# Patient Record
Sex: Female | Born: 1940 | Race: Black or African American | Hispanic: No | Smoking: Never smoker
Health system: Southern US, Community
[De-identification: ages and names within clinical notes are randomized; demographics above are authoritative.]

## PROBLEM LIST (undated history)

## (undated) DIAGNOSIS — I10 Essential (primary) hypertension: Secondary | ICD-10-CM

## (undated) DIAGNOSIS — D869 Sarcoidosis, unspecified: Secondary | ICD-10-CM

## (undated) DIAGNOSIS — H269 Unspecified cataract: Secondary | ICD-10-CM

---

## 1998-05-29 ENCOUNTER — Other Ambulatory Visit: Admission: RE | Admit: 1998-05-29 | Discharge: 1998-05-29 | Payer: Self-pay | Admitting: Obstetrics and Gynecology

## 1998-06-20 ENCOUNTER — Ambulatory Visit (HOSPITAL_COMMUNITY): Admission: RE | Admit: 1998-06-20 | Discharge: 1998-06-20 | Payer: Self-pay | Admitting: Obstetrics and Gynecology

## 2000-06-01 ENCOUNTER — Encounter: Admission: RE | Admit: 2000-06-01 | Discharge: 2000-06-01 | Payer: Self-pay | Admitting: *Deleted

## 2000-09-26 ENCOUNTER — Encounter: Payer: Self-pay | Admitting: *Deleted

## 2000-09-26 ENCOUNTER — Encounter: Admission: RE | Admit: 2000-09-26 | Discharge: 2000-09-26 | Payer: Self-pay | Admitting: *Deleted

## 2001-07-13 ENCOUNTER — Other Ambulatory Visit: Admission: RE | Admit: 2001-07-13 | Discharge: 2001-07-13 | Payer: Self-pay | Admitting: Obstetrics and Gynecology

## 2001-10-06 ENCOUNTER — Encounter: Admission: RE | Admit: 2001-10-06 | Discharge: 2001-10-06 | Payer: Self-pay | Admitting: Internal Medicine

## 2001-10-06 ENCOUNTER — Encounter: Payer: Self-pay | Admitting: Internal Medicine

## 2002-11-27 ENCOUNTER — Encounter: Admission: RE | Admit: 2002-11-27 | Discharge: 2002-11-27 | Payer: Self-pay | Admitting: Internal Medicine

## 2002-11-27 ENCOUNTER — Encounter: Payer: Self-pay | Admitting: Internal Medicine

## 2003-11-29 ENCOUNTER — Encounter: Admission: RE | Admit: 2003-11-29 | Discharge: 2003-11-29 | Payer: Self-pay | Admitting: Internal Medicine

## 2003-12-23 ENCOUNTER — Other Ambulatory Visit: Admission: RE | Admit: 2003-12-23 | Discharge: 2003-12-23 | Payer: Self-pay | Admitting: Obstetrics and Gynecology

## 2004-01-30 ENCOUNTER — Inpatient Hospital Stay (HOSPITAL_COMMUNITY): Admission: RE | Admit: 2004-01-30 | Discharge: 2004-02-01 | Payer: Self-pay | Admitting: Obstetrics and Gynecology

## 2004-01-30 ENCOUNTER — Encounter (INDEPENDENT_AMBULATORY_CARE_PROVIDER_SITE_OTHER): Payer: Self-pay | Admitting: Specialist

## 2004-11-12 IMAGING — CR DG CHEST 2V
2 series · 2 of 2 positions shown · non-contrast
Comparison: none

CLINICAL DATA: Preoperative respiratory exam. Fibroids and uterine bleeding.  History of sarcoidosis.
 TWO VIEW CHEST   
 The heart size is normal.  There are diffuse interstitial lung changes with hilar adenopathy.  There is no effusion or active infiltrate.
 IMPRESSION
 1.  No acute abnormality.
 2.  Interstitial lung disease and hilar adenopathy compatible with the clinical diagnosis of sarcoidosis.

[view not recorded (1 of 2)]
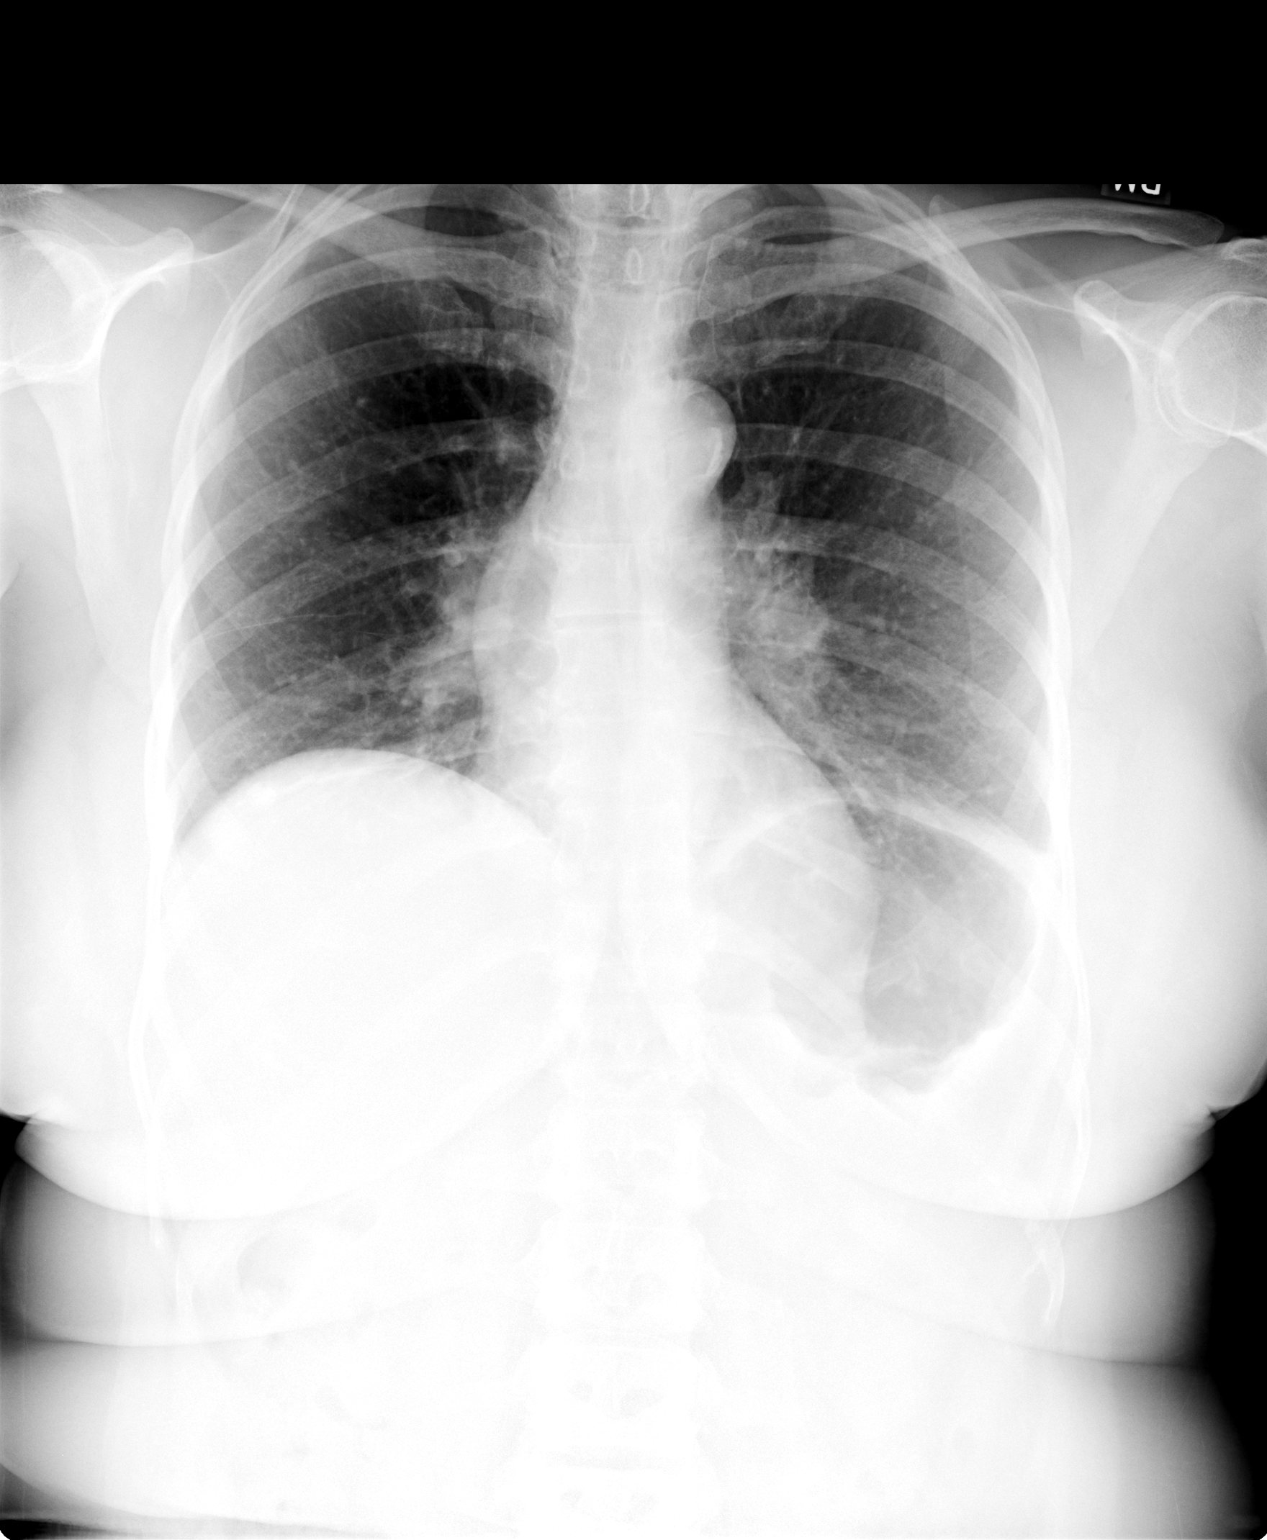

[view not recorded (2 of 2)]
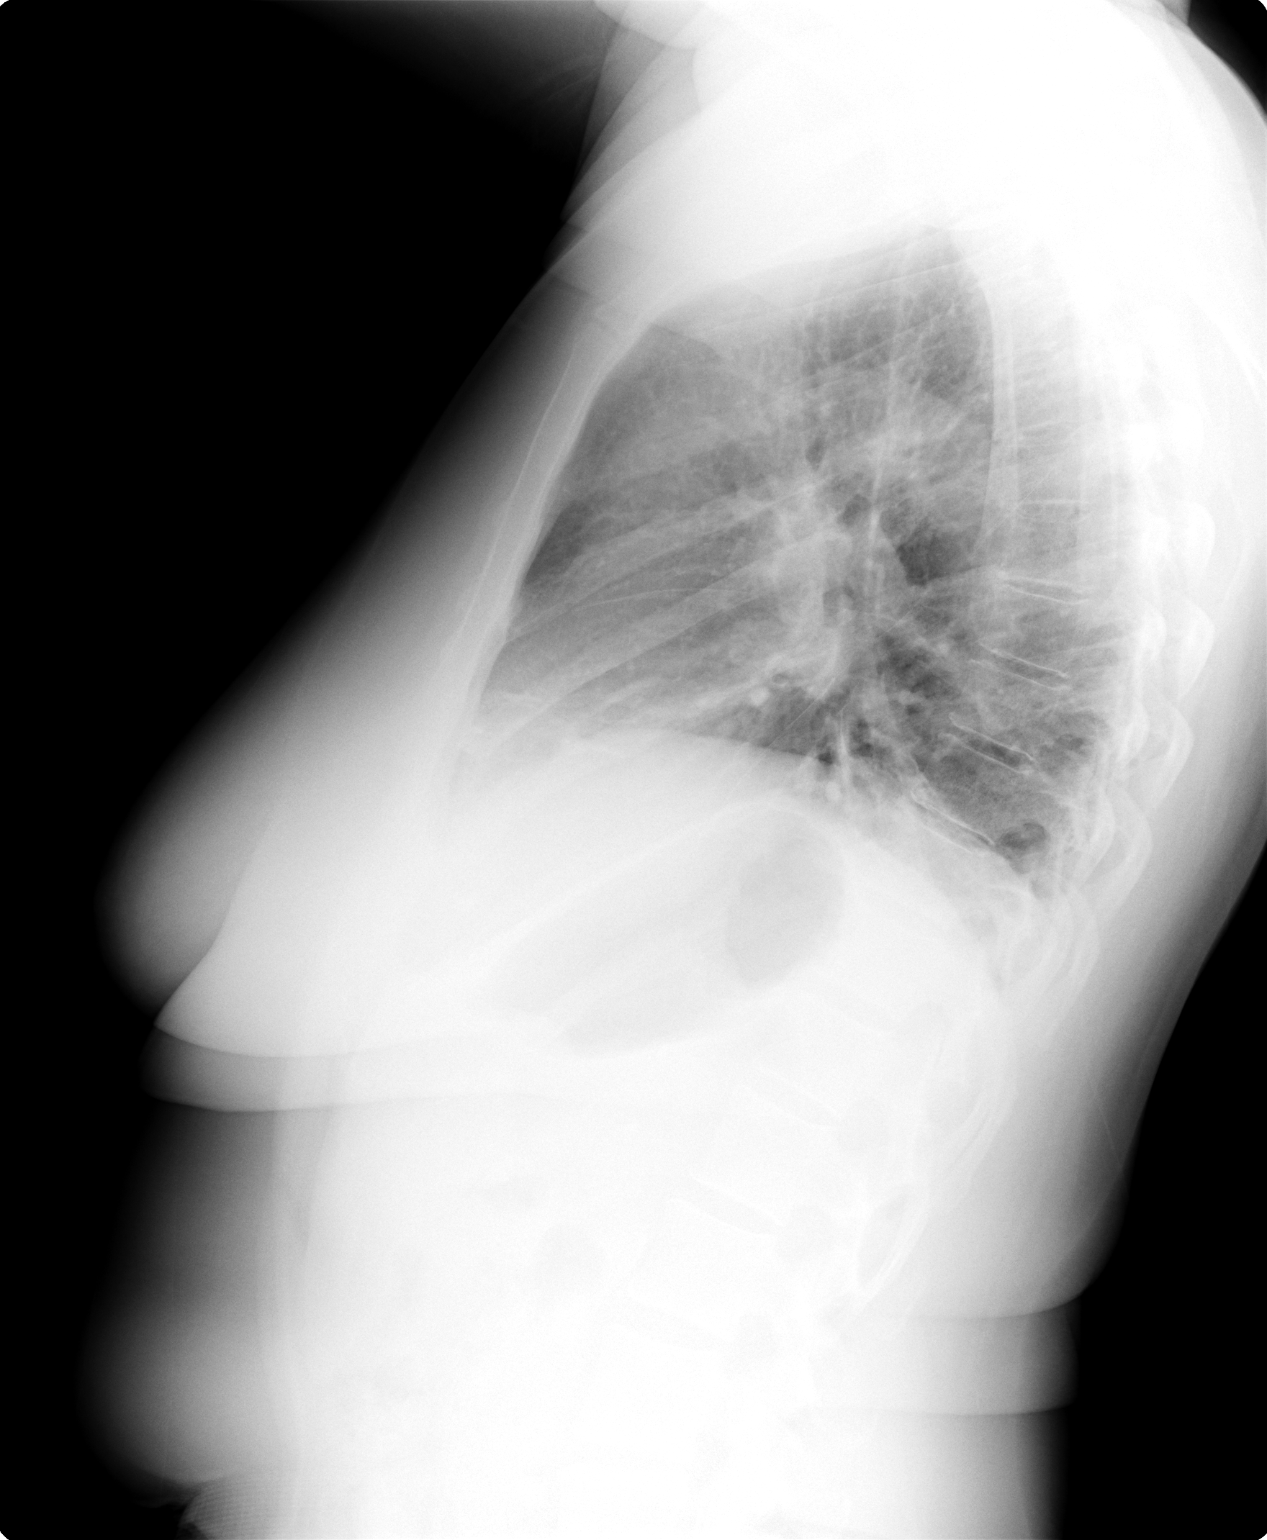

[2 of 2 positions shown; findings below may reference images not displayed]

## 2004-12-16 ENCOUNTER — Encounter: Admission: RE | Admit: 2004-12-16 | Discharge: 2004-12-16 | Payer: Self-pay | Admitting: Internal Medicine

## 2004-12-29 ENCOUNTER — Encounter: Admission: RE | Admit: 2004-12-29 | Discharge: 2004-12-29 | Payer: Self-pay | Admitting: Internal Medicine

## 2006-01-17 ENCOUNTER — Encounter: Admission: RE | Admit: 2006-01-17 | Discharge: 2006-01-17 | Payer: Self-pay | Admitting: Internal Medicine

## 2007-01-27 ENCOUNTER — Encounter: Admission: RE | Admit: 2007-01-27 | Discharge: 2007-01-27 | Payer: Self-pay | Admitting: Internal Medicine

## 2007-02-10 ENCOUNTER — Encounter: Admission: RE | Admit: 2007-02-10 | Discharge: 2007-02-10 | Payer: Self-pay | Admitting: Internal Medicine

## 2008-02-16 ENCOUNTER — Encounter: Admission: RE | Admit: 2008-02-16 | Discharge: 2008-02-16 | Payer: Self-pay | Admitting: Internal Medicine

## 2009-04-15 ENCOUNTER — Encounter: Admission: RE | Admit: 2009-04-15 | Discharge: 2009-04-15 | Payer: Self-pay | Admitting: Internal Medicine

## 2010-09-16 ENCOUNTER — Encounter: Admission: RE | Admit: 2010-09-16 | Discharge: 2010-09-16 | Payer: Self-pay | Admitting: Internal Medicine

## 2011-01-03 ENCOUNTER — Encounter: Payer: Self-pay | Admitting: Internal Medicine

## 2011-04-30 NOTE — Discharge Summary (Signed)
NAME:  [name] Combs, [name] Combs NO.:  [id number]   MEDICAL RECORD NO.:  [id number]                   PATIENT TYPE:  INP   LOCATION:  0453                                 FACILITY:  [facility]   PHYSICIAN:  Malachi Pro. Ambrose Mantle, M.D.              DATE OF BIRTH:  December 20, 1940   DATE OF ADMISSION:  01/30/2004  DATE OF DISCHARGE:  02/01/2004                                 DISCHARGE SUMMARY   HISTORY:  This is a 70 year old black female admitted with persistent  postmenopausal bleeding and known leiomyomata uteri for abdominal  hysterectomy and bilateral salpingo-oophorectomy.  The patient underwent  that procedure by Dr. Ambrose Mantle with Dr. Jackelyn Knife assisting under general  anesthesia on January 30, 2004.  Postoperatively, she did well.  She did  have a temperature elevation to 100.6 degrees on the evening of the first  postoperative day but she had no symptoms.  Cath urine was negative except  for 7-10 red cells that were attributable to having had the catheter.  Her  abdomen was soft and nontender.  Her incision was healing well, the staples  were intact. She was passing flatus without difficulty, tolerating a regular  diet and she was considered a candidate for discharge on the second  postoperative day.   LABORATORY DATA:  Hemoglobin on admission 14.1, hematocrit 41.8, white count  4200, platelet count 261,000.  Followup hematocrits were 36.3 and 34.0.  Complete metabolic profile was normal except for a glucose of 165.  The  specimen was obtained at 10 a.m. on January 24, 2004 for the complete  metabolic profile.  Urinalysis on admission showed many red cells because  she had vaginal bleeding.  Path report showed uterus, fallopian tubes and  ovaries.  The cervix had benign ectocervical and endocervical mucosa. The  uterine corpus had a benign endometrial polyp, cystic atrophic type  endometrium, submucosal and intramural leiomyomata, benign ovaries and  fallopian tubes  bilaterally. The uterine weight was 276 g.  The uterus had a  few submucosal leiomyomas ranging from 1.1 to 5.2 cm in greatest dimension.  The myometrium was distorted by multiple intramural leiomyomas ranging from  0.3 to 3 cm.  There were a few red soft endometrial polyps ranging from 0.9  to 1.5 cm in greatest dimension.  Chest x-ray showed no acute abnormality,  there was interstitial lung disease and hilar adenopathy compatible with a  clinical diagnosis of sarcoidosis.  EKG showed a normal sinus rhythm, no  change in the EKG.   FINAL DIAGNOSES:  1. Leiomyomata uteri submucous and intramural.  2. Endometrial polyps.  3. Persistent postmenopausal bleeding.  4. Sarcoidosis.  5. Gout.  6. Hypertension.   OPERATION:  Abdominal hysterectomy, bilateral salpingo-oophorectomy.   FINAL CONDITION:  Improved.   DISCHARGE INSTRUCTIONS:  1. Regular diet.  2. No vaginal entrance.  3. No heavy lifting or strenuous activity.  4. Cough and deep  breath frequently holding her incision at the time.  5. Report any temperature elevation greater than 101 degrees.  6. Return to see me in two days to have her staples removed and to review     her temperature record.   OPERATION:  Abdominal hysterectomy, bilateral salpingo-oophorectomy.   MEDICATIONS:  1. Percocet 5/325, 20 tablets one every 4-6h. as needed for pain.  2. The patient will resume her Benicar 40 mg daily.  3. Allopurinol 100 mg daily.  4. She will take colchicine 0.6 mg p.r.n. for gout attacks.   I will see the patient in the office in two days and in the meantime she is  to take her temperature every 4h. while awake and report the temperature  elevation that I described earlier.                                               Malachi Pro. Ambrose Mantle, M.D.    TFH/MEDQ  D:  02/01/2004  T:  02/01/2004  Job:  [id number]

## 2011-04-30 NOTE — Op Note (Signed)
NAME:  [name] Combs, [name] Combs NO.:  [id number]   MEDICAL RECORD NO.:  [id number]                   PATIENT TYPE:  INP   LOCATION:  0001                                 FACILITY:  [facility]   PHYSICIAN:  Malachi Pro. Ambrose Mantle, M.D.              DATE OF BIRTH:  1941/09/23   DATE OF PROCEDURE:  01/30/2004  DATE OF DISCHARGE:                                 OPERATIVE REPORT   PREOPERATIVE DIAGNOSES:  Leiomyomata uteri, persistent postmenopausal  bleeding.   POSTOPERATIVE DIAGNOSES:  Leiomyomata uteri, persistent postmenopausal  bleeding.   OPERATION:  Abdominal hysterectomy, bilateral salpingo-oophorectomy.   SURGEON:  Malachi Pro. Ambrose Mantle, M.D.   ASSISTANT:  Zenaida Niece, M.D.   ANESTHESIA:  General.   The patient was brought to the operating room and placed under satisfactory  general anesthesia, placed in frog leg position. The vulva, vagina,  perineum, urethra and abdomen were prepped with Betadine solution.  A Foley  catheter was inserted to straight drain.  The patient's abdominal wall was  quite abnormal in shape with a panniculus between the pubis and the  umbilicus.  The patient was then placed supine, the abdomen was draped as a  sterile field. A suprapubic transverse incision was made and carried in  layers through the skin, subcutaneous tissue and fascia. An incision was  made higher on the lower abdominal wall to try to avoid the crease formed by  her panniculus.  The fascia was then separated from the rectus muscle  superiorly and inferiorly, the rectus muscle was already split in the  midline, the peritoneum was opened vertically.  Exploration of the upper  abdomen revealed the liver to be smooth, both kidneys felt slightly small  but no other abnormalities.  I did not feel the gallbladder. The bowel  looked normal. Exploration of the pelvis revealed the tubes and ovaries to  be normal bilaterally. The uterus was enlarged to probably 10-12 week  size  with multiple fibroids.  Packs and retractors were used for exposure. The  upper pedicles were clamped across with Kelly clamps. Both infundibulopelvic  ligament were divided between clamps and doubly suture ligated. The round  ligaments were divided with the electrical current, bladder flap was  developed, uterine vessels were skeletonized, clamped, cut and suture  ligated.  Parametrial and paracervical tissues were clamped, cut and suture  ligated and the uterosacral ligaments were clamped, cut and suture ligated  and held.  The vaginal angles were entered and the uterus was removed by  transecting the vagina, vaginal angles sutures were placed and then the  central portion of the cuff was closed with interrupted figure-of-eight  sutures of #0 Vicryl.  Hemostasis was obtained with a combination of  electrical current and with suture.  Liberal irrigation confirmed  hemostasis, the bladder was filled with methylene blue stained, fluid.  There was no leakage and there were no sutures  close to the bladder.  Reperitonealization was done across the vaginal cuff.  Ureters were palpated  and were completely normal.  Packs and retractors were removed and the  abdominal wall was closed with a running suture of #0 Vicryl on the  peritoneum.  Interrupted #0 Vicryl on the rectus muscles, two running  sutures of #0 Vicryl on the fascia, a running  3-0 Vicryl on the subcu tissues, staples on the skin.  The patient seemed to  tolerate the procedure well. Blood loss was about 450 mL.  This  was because  the patient had significant bleeding from the right side of the uterus early  in the case when visibility was minimal laterally.                                               Malachi Pro. Ambrose Mantle, M.D.    TFH/MEDQ  D:  01/30/2004  T:  01/30/2004  Job:  [id number]

## 2011-04-30 NOTE — H&P (Signed)
NAME:  Combs Combs NO.:  [id number]   MEDICAL RECORD NO.:  [id number]                   PATIENT TYPE:  INP   LOCATION:  NA                                   FACILITY:  [facility]   PHYSICIAN:  Malachi Pro. Ambrose Mantle, M.D.              DATE OF BIRTH:  04-07-1941   DATE OF ADMISSION:  DATE OF DISCHARGE:                                HISTORY & PHYSICAL   ANTICIPATED DATE OF ADMISSION:  January 30, 2004.   HISTORY OF PRESENT ILLNESS:  [age]-year-old black female para 2, 0, 0, 2  who is admitted to the hospital for an abdominal hysterectomy and bilateral  salpingo-oophorectomy because of persistent abnormal uterine bleeding and  enlarging fibroids.   I saw this patient initially in 1990 and performed an endometrial biopsy.  Since then I have performed numerous endometrial biopsies for abnormal  bleeding.  At the present time she has been bleeding everyday for at least  six weeks and up to that point was having intermittent bleeding lasting up  to two weeks at irregular intervals.  The patient underwent her last  endometrial biopsy on December 23, 2003 that showed abundant blood clot  containing scant strips of benign columnar mucosa.  The patient underwent a  D&C and hysteroscopic resection of leiomyoma in 1999, but because the  fibroids were located low in the lower uterine segment and endocervical  canal it was impossible to remove all them.  I have advised her to have a  hysterectomy on more than one occasions and she has finally elected to  proceed with that.   ALLERGIES:  ASPIRIN.   PAST SURGICAL HISTORY:  1. D&C hysteroscopy.  2. Hysteroscopic resection of benign fibroids.  3. Bronchoscopy.   PAST MEDICAL HISTORY:  Sarcoidosis.   FAMILY HISTORY:  Mother died at 37 of a stroke.  Father died at 46 of a  stroke.  On sister and three brothers are living and well.   SOCIAL HISTORY:  The patient does not drink or smoke.   REVIEW OF SYSTEMS:   Review of systems is negative.   MEDICATIONS:  Benicar and a gastrointestinal medication.   PHYSICAL EXAMINATION:  GENERAL APPEARANCE:  Physical exam reveals a well-  developed, well-nourished black female in no distress.  VITAL SIGNS:  Blood pressure 136/90-Monocryl pulse of 80 and weight 181  pounds.  HEENT:  Head, eyes, ears, nose, and throat reveal no cranial abnormalities.  Extraocular movements are intact.  Nose and pharynx are clear.  There is a  torus palatinus.  BREASTS:  The breasts are soft without masses.  NECK:  Neck supple without thyromegaly.  HEART:  Normal size and sounds.  No murmurs.  LUNGS:  Lungs are clear to P&A.  ABDOMEN:  Abdomen is soft and nontender.  No masses are palpable.  Liver,  spleen and kidneys are not felt.  VAGINAL EXAMINATION:  Vulva and  vagina show some blood to be present. The  cervix is clean.  The uterus is very retroverted, approximately 14 weeks  size, it feels like a grapefruit.  The adnexa are clear.  RECTAL:  Rectal exam on December 23, 2003 was negative.  PELVIC EXAMINATION:  Pap smear on December 23, 2003 was within normal limits.   ADMITTING IMPRESSION:  1. Large leiomyomata uteri.  2. Persistent postmenopausal uterine bleeding.  3. Sarcoidosis.   The patient is admitted for abdominal hysterectomy and bilateral salpingo-  oophorectomy. She has been informed of the risks of surgery including, but  not limited to heart attack, stroke, pulmonary embolus, wound disruption,  hemorrhage with need for reoperation and/or transfusion, fistula formation,  nerve injury, and intestinal obstruction.  She understands and agrees to  proceed with surgery.  She has also been informed that the surgery could  affect her sex drive which is now significantly less than it was when she  was a younger woman.                                               Malachi Pro. Ambrose Mantle, M.D.    TFH/MEDQ  D:  01/29/2004  T:  01/30/2004  Job:  [id number]

## 2011-08-10 ENCOUNTER — Other Ambulatory Visit: Payer: Self-pay | Admitting: Internal Medicine

## 2011-08-10 DIAGNOSIS — Z1231 Encounter for screening mammogram for malignant neoplasm of breast: Secondary | ICD-10-CM

## 2011-09-20 ENCOUNTER — Ambulatory Visit
Admission: RE | Admit: 2011-09-20 | Discharge: 2011-09-20 | Disposition: A | Discharge status: Home / Self Care | Payer: Medicare Other | Source: Ambulatory Visit | Attending: Internal Medicine | Admitting: Internal Medicine

## 2011-09-20 DIAGNOSIS — Z1231 Encounter for screening mammogram for malignant neoplasm of breast: Secondary | ICD-10-CM

## 2012-08-16 ENCOUNTER — Other Ambulatory Visit: Payer: Self-pay | Admitting: Internal Medicine

## 2012-08-16 DIAGNOSIS — Z1231 Encounter for screening mammogram for malignant neoplasm of breast: Secondary | ICD-10-CM

## 2012-10-02 ENCOUNTER — Ambulatory Visit
Admission: RE | Admit: 2012-10-02 | Discharge: 2012-10-02 | Disposition: A | Discharge status: Home / Self Care | Payer: Medicare Other | Source: Ambulatory Visit | Attending: Internal Medicine | Admitting: Internal Medicine

## 2012-10-02 DIAGNOSIS — Z1231 Encounter for screening mammogram for malignant neoplasm of breast: Secondary | ICD-10-CM

## 2013-08-28 ENCOUNTER — Other Ambulatory Visit: Payer: Self-pay

## 2013-08-28 DIAGNOSIS — Z1231 Encounter for screening mammogram for malignant neoplasm of breast: Secondary | ICD-10-CM

## 2013-10-03 ENCOUNTER — Ambulatory Visit
Admission: RE | Admit: 2013-10-03 | Discharge: 2013-10-03 | Disposition: A | Discharge status: Home / Self Care | Payer: Medicare Other | Source: Ambulatory Visit

## 2013-10-03 DIAGNOSIS — Z1231 Encounter for screening mammogram for malignant neoplasm of breast: Secondary | ICD-10-CM

## 2014-09-03 ENCOUNTER — Other Ambulatory Visit: Payer: Self-pay

## 2014-09-03 DIAGNOSIS — Z1231 Encounter for screening mammogram for malignant neoplasm of breast: Secondary | ICD-10-CM

## 2014-10-04 ENCOUNTER — Ambulatory Visit
Admission: RE | Admit: 2014-10-04 | Discharge: 2014-10-04 | Disposition: A | Discharge status: Home / Self Care | Payer: Medicare Other | Source: Ambulatory Visit

## 2014-10-04 DIAGNOSIS — Z1231 Encounter for screening mammogram for malignant neoplasm of breast: Secondary | ICD-10-CM

## 2015-10-06 ENCOUNTER — Other Ambulatory Visit: Payer: Self-pay

## 2015-10-06 DIAGNOSIS — Z1231 Encounter for screening mammogram for malignant neoplasm of breast: Secondary | ICD-10-CM

## 2015-10-16 ENCOUNTER — Ambulatory Visit
Admission: RE | Admit: 2015-10-16 | Discharge: 2015-10-16 | Disposition: A | Discharge status: Home / Self Care | Payer: Medicare Other | Source: Ambulatory Visit

## 2015-10-16 DIAGNOSIS — Z1231 Encounter for screening mammogram for malignant neoplasm of breast: Secondary | ICD-10-CM

## 2016-09-13 ENCOUNTER — Other Ambulatory Visit: Payer: Self-pay | Admitting: Internal Medicine

## 2016-09-13 DIAGNOSIS — Z1231 Encounter for screening mammogram for malignant neoplasm of breast: Secondary | ICD-10-CM

## 2016-10-18 ENCOUNTER — Ambulatory Visit
Admission: RE | Admit: 2016-10-18 | Discharge: 2016-10-18 | Disposition: A | Discharge status: Home / Self Care | Payer: Medicare Other | Source: Ambulatory Visit | Attending: Internal Medicine | Admitting: Internal Medicine

## 2016-10-18 DIAGNOSIS — Z1231 Encounter for screening mammogram for malignant neoplasm of breast: Secondary | ICD-10-CM

## 2017-09-07 ENCOUNTER — Other Ambulatory Visit: Payer: Self-pay | Admitting: Internal Medicine

## 2017-09-07 DIAGNOSIS — Z1231 Encounter for screening mammogram for malignant neoplasm of breast: Secondary | ICD-10-CM

## 2017-10-19 ENCOUNTER — Ambulatory Visit
Admission: RE | Admit: 2017-10-19 | Discharge: 2017-10-19 | Disposition: A | Discharge status: Home / Self Care | Payer: Medicare Other | Source: Ambulatory Visit | Attending: Internal Medicine | Admitting: Internal Medicine

## 2017-10-19 DIAGNOSIS — Z1231 Encounter for screening mammogram for malignant neoplasm of breast: Secondary | ICD-10-CM

## 2018-10-02 ENCOUNTER — Other Ambulatory Visit: Payer: Self-pay | Admitting: Internal Medicine

## 2018-10-02 DIAGNOSIS — Z1231 Encounter for screening mammogram for malignant neoplasm of breast: Secondary | ICD-10-CM

## 2018-11-07 ENCOUNTER — Ambulatory Visit: Payer: Medicare Other

## 2019-01-04 ENCOUNTER — Inpatient Hospital Stay: Admission: RE | Admit: 2019-01-04 | Payer: Medicare Other | Source: Ambulatory Visit

## 2022-09-09 ENCOUNTER — Encounter (HOSPITAL_COMMUNITY): Payer: Self-pay

## 2022-09-09 ENCOUNTER — Other Ambulatory Visit: Payer: Self-pay

## 2022-09-09 ENCOUNTER — Encounter (HOSPITAL_BASED_OUTPATIENT_CLINIC_OR_DEPARTMENT_OTHER): Payer: Self-pay | Admitting: Emergency Medicine

## 2022-09-09 ENCOUNTER — Emergency Department (HOSPITAL_BASED_OUTPATIENT_CLINIC_OR_DEPARTMENT_OTHER): Payer: Medicare PPO

## 2022-09-09 ENCOUNTER — Emergency Department (HOSPITAL_BASED_OUTPATIENT_CLINIC_OR_DEPARTMENT_OTHER): Payer: Medicare PPO | Admitting: Radiology

## 2022-09-09 ENCOUNTER — Inpatient Hospital Stay (HOSPITAL_BASED_OUTPATIENT_CLINIC_OR_DEPARTMENT_OTHER)
Admission: EM | Admit: 2022-09-09 | Discharge: 2022-09-12 | DRG: 689 | Disposition: A | Discharge status: Home Health | Payer: Medicare PPO | Attending: Internal Medicine | Admitting: Internal Medicine

## 2022-09-09 DIAGNOSIS — G9341 Metabolic encephalopathy: Secondary | ICD-10-CM | POA: Diagnosis present

## 2022-09-09 DIAGNOSIS — Z20822 Contact with and (suspected) exposure to covid-19: Secondary | ICD-10-CM | POA: Diagnosis present

## 2022-09-09 DIAGNOSIS — D860 Sarcoidosis of lung: Secondary | ICD-10-CM | POA: Diagnosis present

## 2022-09-09 DIAGNOSIS — Z885 Allergy status to narcotic agent status: Secondary | ICD-10-CM

## 2022-09-09 DIAGNOSIS — F05 Delirium due to known physiological condition: Secondary | ICD-10-CM | POA: Diagnosis present

## 2022-09-09 DIAGNOSIS — N390 Urinary tract infection, site not specified: Secondary | ICD-10-CM | POA: Diagnosis not present

## 2022-09-09 DIAGNOSIS — E860 Dehydration: Secondary | ICD-10-CM | POA: Diagnosis present

## 2022-09-09 DIAGNOSIS — Z91018 Allergy to other foods: Secondary | ICD-10-CM

## 2022-09-09 DIAGNOSIS — I10 Essential (primary) hypertension: Secondary | ICD-10-CM | POA: Diagnosis present

## 2022-09-09 DIAGNOSIS — Z79899 Other long term (current) drug therapy: Secondary | ICD-10-CM

## 2022-09-09 DIAGNOSIS — R824 Acetonuria: Secondary | ICD-10-CM | POA: Diagnosis present

## 2022-09-09 DIAGNOSIS — R4182 Altered mental status, unspecified: Secondary | ICD-10-CM | POA: Diagnosis not present

## 2022-09-09 DIAGNOSIS — F03918 Unspecified dementia, unspecified severity, with other behavioral disturbance: Secondary | ICD-10-CM | POA: Diagnosis present

## 2022-09-09 DIAGNOSIS — B9620 Unspecified Escherichia coli [E. coli] as the cause of diseases classified elsewhere: Secondary | ICD-10-CM | POA: Diagnosis present

## 2022-09-09 HISTORY — DX: Essential (primary) hypertension: I10

## 2022-09-09 HISTORY — DX: Unspecified cataract: H26.9

## 2022-09-09 HISTORY — DX: Sarcoidosis, unspecified: D86.9

## 2022-09-09 LAB — COMPREHENSIVE METABOLIC PANEL
ALT: 6 U/L (ref 0–44)
AST: 17 U/L (ref 15–41)
Albumin: 3.8 g/dL (ref 3.5–5.0)
Alkaline Phosphatase: 80 U/L (ref 38–126)
Anion gap: 10 (ref 5–15)
BUN: 12 mg/dL (ref 8–23)
CO2: 29 mmol/L (ref 22–32)
Calcium: 10 mg/dL (ref 8.9–10.3)
Chloride: 101 mmol/L (ref 98–111)
Creatinine, Ser: 0.6 mg/dL (ref 0.44–1.00)
GFR, Estimated: >60 mL/min (ref >60)
Glucose, Bld: 73 mg/dL (ref 70–99)
Potassium: 4.4 mmol/L (ref 3.5–5.1)
Sodium: 140 mmol/L (ref 135–145)
Total Bilirubin: 0.9 mg/dL (ref 0.3–1.2)
Total Protein: 7.7 g/dL (ref 6.5–8.1)

## 2022-09-09 LAB — RESP PANEL BY RT-PCR (FLU A&B, COVID) ARPGX2
Influenza A by PCR: NEGATIVE
Influenza B by PCR: NEGATIVE
SARS Coronavirus 2 by RT PCR: NEGATIVE

## 2022-09-09 LAB — CBC WITH DIFFERENTIAL/PLATELET
Abs Immature Granulocytes: 0.01 10*3/uL (ref 0.00–0.07)
Basophils Absolute: 0 10*3/uL (ref 0.0–0.1)
Basophils Relative: 0 %
Eosinophils Absolute: 0.1 10*3/uL (ref 0.0–0.5)
Eosinophils Relative: 2 %
HCT: 39.7 % (ref 36.0–46.0)
Hemoglobin: 12.7 g/dL (ref 12.0–15.0)
Immature Granulocytes: 0 %
Lymphocytes Relative: 18 %
Lymphs Abs: 0.9 10*3/uL (ref 0.7–4.0)
MCH: 29.5 pg (ref 26.0–34.0)
MCHC: 32 g/dL (ref 30.0–36.0)
MCV: 92.3 fL (ref 80.0–100.0)
Monocytes Absolute: 0.7 10*3/uL (ref 0.1–1.0)
Monocytes Relative: 15 %
Neutro Abs: 3.2 10*3/uL (ref 1.7–7.7)
Neutrophils Relative %: 65 %
Platelets: 330 10*3/uL (ref 150–400)
RBC: 4.3 MIL/uL (ref 3.87–5.11)
RDW: 13.3 % (ref 11.5–15.5)
WBC: 4.9 10*3/uL (ref 4.0–10.5)
nRBC: 0 % (ref 0.0–0.2)

## 2022-09-09 LAB — URINALYSIS, ROUTINE W REFLEX MICROSCOPIC
Bilirubin Urine: NEGATIVE
Glucose, UA: NEGATIVE mg/dL
Hgb urine dipstick: NEGATIVE
Nitrite: NEGATIVE
Protein, ur: NEGATIVE mg/dL
Specific Gravity, Urine: 1.015 (ref 1.005–1.030)
pH: 7 (ref 5.0–8.0)

## 2022-09-09 LAB — AMMONIA: Ammonia: 22 umol/L (ref 9–35)

## 2022-09-09 LAB — TSH: TSH: 2.348 u[IU]/mL (ref 0.350–4.500)

## 2022-09-09 MED ORDER — SODIUM CHLORIDE 0.9 % IV SOLN
1.0000 g | Freq: Once | INTRAVENOUS | Status: AC
  Administered 2022-09-09: 1 g via INTRAVENOUS
  Filled 2022-09-09: qty 10

## 2022-09-09 NOTE — ED Provider Notes (Signed)
Signout from Dr. Truett Mainland.  81 year old female brought in by her family member for evaluation of confusion.  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  She has no acute medical complaints. Physical Exam  BP (!) 179/110   Pulse 82   Temp 98.9 F (37.2 C) (Oral)   Resp 19   Ht 5\' 5"  (1.651 m)   Wt 54.4 kg   SpO2 99%   BMI 19.97 kg/m   Physical Exam  Procedures  Procedures  ED Course / MDM   Clinical Course as of 09/09/22 1922  Thu Sep 09, 2022  1556 Signed out to Dr. Melina Copa pending remainder of confusion workup.  [WS]    Clinical Course User Index [WS] Cristie Hem, MD   Medical Decision Making Amount and/or Complexity of Data Reviewed Labs: ordered. Radiology: ordered. ECG/medicine tests: ordered.  Risk Decision regarding hospitalization.   Her work-up has been fairly unremarkable with normal head CT normal labs.  Blood pressure elevated history of hypertension unclear if she has been taking her medications.  Urine is a possible source not having any dysuria.  Will send for culture and cover with antibiotics.  Son was inclined to have her admitted for further work-up.  Discussed with Dr. Posey Pronto [facility] hospitalist who will put the patient in for an admission at either campus.       Hayden Rasmussen, MD 09/10/22 1007

## 2022-09-09 NOTE — ED Provider Notes (Signed)
[place] EMERGENCY DEPT Provider Note  CSN: [id number] Arrival date & time: 09/09/22 1105  Chief Complaint(s) Altered Mental Status  HPI [name] Combs is a 81 y.o. female without significant past medical history presenting to the emergency department for the confusion.  Last night patient apparently left home at 12:30 AM and returned at 2:30 AM, which is very atypical for the patient.  This was noticed because the ring camera went off.  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  Son reports that the car was parked strangely and she had peed on the seat.  Patient currently denies any complaints, denies any headaches, nausea, vomiting, fevers or chills, abdominal pain, chest pain, dysuria, leg swelling.  Son reports that still seems confused   Past Medical History Past Medical History:  Diagnosis Date   Cataract    Hypertension    Sarcoidosis    There are no problems to display for this patient.  Home Medication(s) Prior to Admission medications   Not on File                                                                                                                                    Past Surgical History History reviewed. No pertinent surgical history. Family History History reviewed. No pertinent family history.  Social History Social History   Tobacco Use   Smoking status: Never   Smokeless tobacco: Never  Vaping Use   Vaping Use: Never used  Substance Use Topics   Alcohol use: Never   Drug use: Never   Allergies Codeine and Sesame seed (diagnostic)  Review of Systems Review of Systems  All other systems reviewed and are negative.   Physical Exam Vital Signs  I have reviewed the triage vital signs BP (!) 183/99   Pulse 98   Temp 98.3 F (36.8 C)   Resp 18   Ht 5\' 5"  (1.651 m)   Wt 54.4 kg   SpO2 98%   BMI 19.97 kg/m  Physical  Exam Vitals and nursing note reviewed.  Constitutional:      General: She is not in acute distress.    Appearance: She is well-developed.  HENT:     Head: Normocephalic and atraumatic.     Mouth/Throat:     Mouth: Mucous membranes are moist.  Eyes:     Pupils: Pupils are equal, round, and reactive to light.  Cardiovascular:     Rate and Rhythm: Normal rate and regular rhythm.     Heart sounds: No murmur heard. Pulmonary:     Effort: Pulmonary effort is normal. No respiratory distress.     Breath sounds: Normal breath sounds.  Abdominal:     General: Abdomen is flat.     Palpations: Abdomen is soft.     Tenderness: There is no abdominal  tenderness.  Musculoskeletal:        General: No tenderness.     Right lower leg: No edema.     Left lower leg: No edema.  Skin:    General: Skin is warm and dry.  Neurological:     General: No focal deficit present.     Mental Status: She is alert.     Cranial Nerves: No cranial nerve deficit.     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  Strength grossly intact in the bilateral upper and lower extremities  Psychiatric:        Mood and Affect: Mood normal.        Behavior: Behavior normal.     ED Results and Treatments Labs (all labs ordered are listed, but only abnormal results are displayed) Labs Reviewed  RESP PANEL BY RT-PCR (FLU A&B, COVID) ARPGX2  URINE CULTURE  CBC WITH DIFFERENTIAL/PLATELET  URINALYSIS, ROUTINE W REFLEX MICROSCOPIC  COMPREHENSIVE METABOLIC PANEL  AMMONIA  TSH                                                                                                                          Radiology DG Chest 1 View  Result Date: 09/09/2022 CLINICAL DATA:  Chest pain EXAM: CHEST  1 VIEW COMPARISON:  Chest 01/24/2004 FINDINGS: Heart size and vascularity normal. Atherosclerotic calcification aortic arch  Mild atelectasis in both bases. No acute infiltrate or effusion. No acute skeletal abnormality. IMPRESSION: Mild bibasilar atelectasis. Electronically Signed   By: Marlan Palau M.D.   On: 09/09/2022 14:57    Pertinent labs & imaging results that were available during my care of the patient were reviewed by me and considered in my medical decision making (see MDM for details).  Medications Ordered in ED Medications - No data to display                                                                                                                                   Procedures Procedures  (including critical care time)  Medical Decision Making / ED Course   MDM:  81 year old female presenting to the emergency department with confusion.  Patient overall well-appearing, vitals notable for mild hypertension, mild tachycardia.  Exam overall reassuring other than slightly confused speech although patient is still oriented.  Unclear cause of symptoms, could represent occult infectious source such as pneumonia or urinary infection, will check these.  Could also represent toxic or metabolic encephalopathy, as hyperammonemia, hyponatremia, hypothyroidism.  Will check basic metabolic panel, ammonia, TSH.  Given confusion will also obtain CT scan to evaluate for acute intracranial pathology although lower concern for this without focal neurologic deficit on exam.  Will reassess closely.  If cause unable to be found, patient may still need to be admitted given persistent confusion.  Clinical Course as of 09/09/22 1556  Thu Sep 09, 2022  1556 Signed out to Dr. Charm Barges pending remainder of confusion workup.  [WS]    Clinical Course User Index [WS] Lonell Grandchild, MD     Additional history obtained: -Additional history obtained from family -External records from outside source obtained and reviewed including: Chart review including previous notes, labs, imaging, consultation notes   Lab  Tests: -I ordered, reviewed, and interpreted labs.   The pertinent results include:   Labs Reviewed  RESP PANEL BY RT-PCR (FLU A&B, COVID) ARPGX2  URINE CULTURE  CBC WITH DIFFERENTIAL/PLATELET  URINALYSIS, ROUTINE W REFLEX MICROSCOPIC  COMPREHENSIVE METABOLIC PANEL  AMMONIA  TSH      EKG   EKG Interpretation  Date/Time:    Ventricular Rate:    PR Interval:    QRS Duration:   QT Interval:    QTC Calculation:   R Axis:     Text Interpretation:           Imaging Studies ordered: I ordered imaging studies including CT head, CXR On my interpretation imaging demonstrates mild basilar atelectasis I independently visualized and interpreted imaging. I agree with the radiologist interpretation   Medicines ordered and prescription drug management: No orders of the defined types were placed in this encounter.   -I have reviewed the patients home medicines and have made adjustments as needed    Cardiac Monitoring: The patient was maintained on a cardiac monitor.  I personally viewed and interpreted the cardiac monitored which showed an underlying rhythm of: NSR  Reevaluation: After the interventions noted above, I reevaluated the patient and found that they have stayed the same  Co morbidities that complicate the patient evaluation  Past Medical History:  Diagnosis Date   Cataract    Hypertension    Sarcoidosis       Dispostion: Pending at sign out    Final Clinical Impression(s) / ED Diagnoses Final diagnoses:  Acute confusional state     This chart was dictated using voice recognition software.  Despite best efforts to proofread,  errors can occur which can change the documentation meaning.    Lonell Grandchild, MD 09/09/22 1556

## 2022-09-09 NOTE — ED Triage Notes (Signed)
Pt lives at home alone. Pt brought by son for possible confusion. 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 Ring camera showed Pt leaving at midnight and coming back at 2030.  There was a scratch on the car and the car was was parked off center.  No one talked to her since this morning at approx 0900.  Pt is a modest person and came to door in underwear.   Pt sa alert to self, location, age and month. 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 Pt can follow commands and denis any other complications.  No deficitas NIH 0

## 2022-09-09 NOTE — ED Notes (Addendum)
Pt awake alert and oriented to person place year.  RR even and unlabored on RA with symmetrical rise and fall of chest.  Continuous cardiac, pulse ox and frequent vital sign monitoring maintained.  Pt denies any c/o dizziness, worsening weakness (reports some weakness at bedside) and denies pain.  No sensory changes, BUE and BLE equal moderate strength noted upon this nurses's exam.  No facial symmetry noted and speech clear.  Pt now repositioned in bed and purewick, already in place, confirmed properly in place.   Will monitor for acute changes and maintain plan of care.  Pt son at bedside.

## 2022-09-10 DIAGNOSIS — Z885 Allergy status to narcotic agent status: Secondary | ICD-10-CM | POA: Diagnosis not present

## 2022-09-10 DIAGNOSIS — Z20822 Contact with and (suspected) exposure to covid-19: Secondary | ICD-10-CM | POA: Diagnosis not present

## 2022-09-10 DIAGNOSIS — G9341 Metabolic encephalopathy: Secondary | ICD-10-CM

## 2022-09-10 DIAGNOSIS — F03918 Unspecified dementia, unspecified severity, with other behavioral disturbance: Secondary | ICD-10-CM | POA: Diagnosis present

## 2022-09-10 DIAGNOSIS — B9620 Unspecified Escherichia coli [E. coli] as the cause of diseases classified elsewhere: Secondary | ICD-10-CM | POA: Diagnosis not present

## 2022-09-10 DIAGNOSIS — R824 Acetonuria: Secondary | ICD-10-CM | POA: Diagnosis not present

## 2022-09-10 DIAGNOSIS — F05 Delirium due to known physiological condition: Secondary | ICD-10-CM | POA: Diagnosis not present

## 2022-09-10 DIAGNOSIS — N390 Urinary tract infection, site not specified: Secondary | ICD-10-CM | POA: Diagnosis not present

## 2022-09-10 DIAGNOSIS — Z79899 Other long term (current) drug therapy: Secondary | ICD-10-CM | POA: Diagnosis not present

## 2022-09-10 DIAGNOSIS — D860 Sarcoidosis of lung: Secondary | ICD-10-CM | POA: Diagnosis not present

## 2022-09-10 DIAGNOSIS — I10 Essential (primary) hypertension: Secondary | ICD-10-CM | POA: Diagnosis not present

## 2022-09-10 DIAGNOSIS — E860 Dehydration: Secondary | ICD-10-CM | POA: Diagnosis not present

## 2022-09-10 DIAGNOSIS — Z91018 Allergy to other foods: Secondary | ICD-10-CM | POA: Diagnosis not present

## 2022-09-10 DIAGNOSIS — R4182 Altered mental status, unspecified: Secondary | ICD-10-CM | POA: Diagnosis present

## 2022-09-10 LAB — URINE CULTURE

## 2022-09-10 MED ORDER — ACETAMINOPHEN 650 MG RE SUPP: 650.0000 mg | Freq: Four times a day (QID) | RECTAL | Status: DC | PRN

## 2022-09-10 MED ORDER — SODIUM CHLORIDE 0.9 % IV SOLN
1.0000 g | INTRAVENOUS | Status: DC
  Administered 2022-09-10 – 2022-09-11 (×2): 1 g via INTRAVENOUS
  Filled 2022-09-10 (×2): qty 10

## 2022-09-10 MED ORDER — ONDANSETRON HCL 4 MG/2ML IJ SOLN: 4.0000 mg | Freq: Four times a day (QID) | INTRAMUSCULAR | Status: DC | PRN

## 2022-09-10 MED ORDER — ONDANSETRON HCL 4 MG PO TABS: 4.0000 mg | ORAL_TABLET | Freq: Four times a day (QID) | ORAL | Status: DC | PRN

## 2022-09-10 MED ORDER — ENOXAPARIN SODIUM 40 MG/0.4ML IJ SOSY
40.0000 mg | PREFILLED_SYRINGE | INTRAMUSCULAR | Status: DC
  Administered 2022-09-10 – 2022-09-11 (×2): 40 mg via SUBCUTANEOUS
  Filled 2022-09-10 (×2): qty 0.4

## 2022-09-10 MED ORDER — ACETAMINOPHEN 325 MG PO TABS: 650.0000 mg | ORAL_TABLET | Freq: Four times a day (QID) | ORAL | Status: DC | PRN

## 2022-09-10 MED ORDER — ALBUTEROL SULFATE (2.5 MG/3ML) 0.083% IN NEBU: 3.0000 mL | INHALATION_SOLUTION | RESPIRATORY_TRACT | Status: DC | PRN

## 2022-09-10 MED ORDER — BISACODYL 5 MG PO TBEC: 5.0000 mg | DELAYED_RELEASE_TABLET | Freq: Every day | ORAL | Status: DC | PRN

## 2022-09-10 MED ORDER — HYDRALAZINE HCL 20 MG/ML IJ SOLN: 5.0000 mg | INTRAMUSCULAR | Status: DC | PRN

## 2022-09-10 MED ORDER — AMLODIPINE BESYLATE 5 MG PO TABS
5.0000 mg | ORAL_TABLET | Freq: Every day | ORAL | Status: DC
  Administered 2022-09-10: 5 mg via ORAL
  Filled 2022-09-10: qty 1

## 2022-09-10 MED ORDER — POLYETHYLENE GLYCOL 3350 17 G PO PACK: 17.0000 g | PACK | Freq: Every day | ORAL | Status: DC | PRN

## 2022-09-10 MED ORDER — LACTATED RINGERS IV SOLN: INTRAVENOUS | Status: DC

## 2022-09-10 MED ORDER — DOCUSATE SODIUM 100 MG PO CAPS
100.0000 mg | ORAL_CAPSULE | Freq: Two times a day (BID) | ORAL | Status: DC
  Administered 2022-09-10 – 2022-09-11 (×3): 100 mg via ORAL
  Filled 2022-09-10 (×4): qty 1

## 2022-09-10 MED ORDER — TRAZODONE HCL 50 MG PO TABS: 50.0000 mg | ORAL_TABLET | Freq: Every evening | ORAL | Status: DC | PRN

## 2022-09-10 NOTE — Progress Notes (Signed)
Patient arrived from [place]. Vitals taken and skin assessment performed with Junie Panning, Therapist, sports. Consult pager paged and made aware that patient needs an attending. Awaiting orders.

## 2022-09-10 NOTE — H&P (Signed)
History and Physical    Patient: [name] Combs SWN:[id number] DOB: Aug 25, 1941 DOA: 09/09/2022 DOS: the patient was seen and examined on 09/10/2022 PCP: Shon Baton, MD  Patient coming from: Home - lives alone; NOK: Kandace Parkins [place], [phone]   Chief Complaint: AMS  HPI: [name] Combs is a 81 y.o. female with medical history significant of HTN and sarcoidosis presenting with AMS. She is accompanied by her daughter. The patient reports having blisters in her groin area and is not oriented to place.  She denies having issues and would like to be discharged.  Her daughter reports that she has had some memory issues but has done ok.  She left her house at noon Wednesday and returned late that evening.  Family is concerned that she may no longer be safe to live alone and needs some help with this since her daughter lives in [place] and her son is in [place] but is planning to move back to [place].  They understand that she probably no longer needs to be driving.  The patient reports that she renewed her license without difficulty last year.    ER Course:  DB to [facility] transfer, per Dr. Posey Pronto:  45F w/ HTN who lives alone and is functionally independent at baseline.  No longer drives.  Son went to check in on her and she appeared confused.  He checked the ring doorbell and saw that she drove away around midnight and returned several hours later.  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  This is apparently a significant acute change from baseline.  She is still confused.   BP elevated otherwise vitals reassuring.  Labs reassuring.  COVID-negative.  CXR and CT head negative for acute changes.  Urinalysis not strongly suggestive of UTI but she was given IV ceftriaxone.  No focal deficits.  Admission requested for further evaluation of AMS.     Review of Systems: As mentioned in the history of present illness. All other systems reviewed and are negative. Past Medical History:   Diagnosis Date   Cataract    Hypertension    Sarcoidosis    History reviewed. No pertinent surgical history. Social History:  reports that she has never smoked. She has never used smokeless tobacco. She reports that she does not drink alcohol and does not use drugs.  Allergies  Allergen Reactions   Codeine Cough   Sesame Seed (Diagnostic) Hives    History reviewed. No pertinent family history.  Prior to Admission medications   Medication Sig Start Date End Date Taking? Authorizing Provider  amLODipine (NORVASC) 5 MG tablet Take 5 mg by mouth at bedtime. 08/20/22   [provider]    Physical Exam: Vitals:   09/10/22 0908 09/10/22 1317 09/10/22 1400 09/10/22 1600  BP: (!) 171/99 (!) 173/100 (!) 142/92 (!) 145/86  Pulse: 96 83 76   Resp: (!) 21 20    Temp: 98.2 F (36.8 C) 98.3 F (36.8 C)  98.3 F (36.8 C)  TempSrc: Oral Oral  Oral  SpO2: 98% 99% 99% 99%  Weight:    54 kg  Height:    5\' 4"  (1.626 m)   General:  Appears calm and comfortable and is in NAD Eyes:  EOMI, normal lids, iris ENT:  grossly normal hearing, lips & tongue, mmm Neck:  no LAD, masses or thyromegaly Cardiovascular:  RRR, no m/r/g. No LE edema.  Respiratory:   CTA bilaterally with no wheezes/rales/rhonchi.  Normal respiratory effort. Abdomen:  soft, NT, ND Skin:  no rash or induration seen on limited exam Musculoskeletal:  grossly normal tone BUE/BLE, good ROM, no bony abnormality Psychiatric:  pleasant but mildly confused mood and affect, speech fluent and appropriate, AOx2 Neurologic:  CN 2-12 grossly intact, moves all extremities in coordinated fashion GU appears normal but there is a PureWick in place and perhaps this plus UTI was confusing to patient?   Radiological Exams on Admission: Independently reviewed - see discussion in A/P where applicable  CT Head Wo Contrast  Result Date: 09/09/2022 CLINICAL DATA:  Delirium, confusion EXAM: CT HEAD WITHOUT CONTRAST TECHNIQUE: Contiguous  axial images were obtained from the base of the skull through the vertex without intravenous contrast. RADIATION DOSE REDUCTION: This exam was performed according to the departmental dose-optimization program which includes automated exposure control, adjustment of the mA and/or kV according to patient size and/or use of iterative reconstruction technique. COMPARISON:  Delirium, confusion FINDINGS: Brain: Generalized atrophy. Normal ventricular morphology. No midline shift or mass effect. Small vessel chronic ischemic changes of deep cerebral white matter. No intracranial hemorrhage, mass lesion, evidence of acute infarction, or extra-axial fluid collection. Vascular: No hyperdense vessels. Atherosclerotic calcifications present at internal carotid arteries bilaterally at skull base Skull: Intact Sinuses/Orbits: Clear Other: N/A IMPRESSION: Atrophy with small vessel chronic ischemic changes of deep cerebral white matter. No acute intracranial abnormalities. Electronically Signed   By: Ulyses Southward M.D.   On: 09/09/2022 16:12   DG Chest 1 View  Result Date: 09/09/2022 CLINICAL DATA:  Chest pain EXAM: CHEST  1 VIEW COMPARISON:  Chest 01/24/2004 FINDINGS: Heart size and vascularity normal. Atherosclerotic calcification aortic arch Mild atelectasis in both bases. No acute infiltrate or effusion. No acute skeletal abnormality. IMPRESSION: Mild bibasilar atelectasis. Electronically Signed   By: Marlan Palau M.D.   On: 09/09/2022 14:57    EKG: Independently reviewed.  NSR with rate 87; LVH; no evidence of acute ischemia   Labs on Admission: I have personally reviewed the available labs and imaging studies at the time of the admission.  Pertinent labs:    Normal CMP Normal CBC Normal TSH COVID/flu negative UA: trace ketones, small LE, rare bacteria Urine culture with >100k colonies of E coli, sensitivities pending   Assessment and Plan: Principal Problem:   Acute metabolic encephalopathy Active  Problems:   E. coli UTI   Dementia with behavioral disturbance (HCC)   Essential hypertension   Pulmonary sarcoidosis (HCC)    Acute metabolic encephalopathy -Patient presenting with encephalopathy as evidenced by her confusion and wandering behavior -Concern for underlying dementia, but this is a change compared to her usual baseline mental status -Evaluation thus far unremarkable -UA was unremarkable but culture is growing E coli so her acute metabolic encephalopathy is suspected to be due to a UTI at this time -She does appear to be mildly dehydrated, as evidenced by her ketonuria, but she does not have renal failure at this time -Based on unremarkable evaluation with current ability to protect her airway, will observe for now with IVF hydration and telemetry monitoring -50% of patients with delirium while hospitalized will be institutionalized at 6 months, and these patients have a 25% mortality at 6 months -The family would benefit from being referred to the [facility] and also provided with the [employer] website   UTI -Will treat with Rocephin -Can transition to PO abx once sensitivities return  Dementia -Concern for more severe disease than was recognized by family (although UTI may have  unmasked it) -PT/OT/ST consults -TOC team consult -May need home health vs. Placement -Patient should not be driving  HTN -Takes amlodipine although daughter is not certain how regularly, will continue  Sarcoidosis -Patient with persistent staccato cough  -Daughter reports remote h/o pulmonary sarcoidosis without treatment in many years -Cough may be associated with sarcoid, although CXR unrevealing -There may also be a behavioral component -Will add prn Albuterol inhaler    Advance Care Planning:   Code Status: Full Code   Consults: PT/OT/ST; TOC team; nutrition  DVT Prophylaxis: Lovenox  Family Communication: Daughter was present throughout evaluation  Severity  of Illness: The appropriate patient status for this patient is OBSERVATION. Observation status is judged to be reasonable and necessary in order to provide the required intensity of service to ensure the patient's safety. The patient's presenting symptoms, physical exam findings, and initial radiographic and laboratory data in the context of their medical condition is felt to place them at decreased risk for further clinical deterioration. Furthermore, it is anticipated that the patient will be medically stable for discharge from the hospital within 2 midnights of admission.   Author: Jonah Blue, MD 09/10/2022 6:14 PM  For on call review [email or web address].

## 2022-09-10 NOTE — ED Notes (Signed)
No acute changes-  Pt remains awake and alert- able to follow simple commands.  Son remains at bedside and reports another family member brought pt a grilled chx salad from [employer] which pt tolerated without issue. Water provided at this time - pt able to also tolerate without coughing/choking/drooling. Continuous cardiac and pulse ox maintained with regular vital sign checks.  Will continue to monitor for acute changes and maintain plan of care.

## 2022-09-10 NOTE — ED Notes (Signed)
Pt resting with eyes closed; RR even and unlabored on RA - no acute changes/distress noted.  Family member at bedside.  Will continue to monitor for acute changes and maintain plan of care as pt awaits bed assignment

## 2022-09-10 NOTE — ED Notes (Signed)
Pt awake and alert - oriented to person, year and city; slow to respond when asked type of facility she is located.  Able to follow simple commands.  No acute distress noted.  Continuous cardiac and pulse ox maintained.  Family member remains at bedside.

## 2022-09-11 DIAGNOSIS — R4182 Altered mental status, unspecified: Secondary | ICD-10-CM | POA: Diagnosis present

## 2022-09-11 DIAGNOSIS — N390 Urinary tract infection, site not specified: Secondary | ICD-10-CM

## 2022-09-11 DIAGNOSIS — Z885 Allergy status to narcotic agent status: Secondary | ICD-10-CM | POA: Diagnosis not present

## 2022-09-11 DIAGNOSIS — I10 Essential (primary) hypertension: Secondary | ICD-10-CM | POA: Diagnosis present

## 2022-09-11 DIAGNOSIS — F03918 Unspecified dementia, unspecified severity, with other behavioral disturbance: Secondary | ICD-10-CM

## 2022-09-11 DIAGNOSIS — F05 Delirium due to known physiological condition: Secondary | ICD-10-CM | POA: Diagnosis present

## 2022-09-11 DIAGNOSIS — Z20822 Contact with and (suspected) exposure to covid-19: Secondary | ICD-10-CM | POA: Diagnosis present

## 2022-09-11 DIAGNOSIS — E860 Dehydration: Secondary | ICD-10-CM | POA: Diagnosis present

## 2022-09-11 DIAGNOSIS — Z79899 Other long term (current) drug therapy: Secondary | ICD-10-CM | POA: Diagnosis not present

## 2022-09-11 DIAGNOSIS — Z91018 Allergy to other foods: Secondary | ICD-10-CM | POA: Diagnosis not present

## 2022-09-11 DIAGNOSIS — D860 Sarcoidosis of lung: Secondary | ICD-10-CM | POA: Diagnosis present

## 2022-09-11 DIAGNOSIS — R531 Weakness: Secondary | ICD-10-CM | POA: Diagnosis not present

## 2022-09-11 DIAGNOSIS — B9620 Unspecified Escherichia coli [E. coli] as the cause of diseases classified elsewhere: Secondary | ICD-10-CM | POA: Diagnosis not present

## 2022-09-11 DIAGNOSIS — R824 Acetonuria: Secondary | ICD-10-CM | POA: Diagnosis present

## 2022-09-11 DIAGNOSIS — G9341 Metabolic encephalopathy: Secondary | ICD-10-CM | POA: Diagnosis not present

## 2022-09-11 LAB — BASIC METABOLIC PANEL
Anion gap: 9 (ref 5–15)
BUN: 10 mg/dL (ref 8–23)
CO2: 28 mmol/L (ref 22–32)
Calcium: 9.5 mg/dL (ref 8.9–10.3)
Chloride: 103 mmol/L (ref 98–111)
Creatinine, Ser: 0.65 mg/dL (ref 0.44–1.00)
GFR, Estimated: >60 mL/min (ref >60)
Glucose, Bld: 94 mg/dL (ref 70–99)
Potassium: 3.9 mmol/L (ref 3.5–5.1)
Sodium: 140 mmol/L (ref 135–145)

## 2022-09-11 LAB — URINE CULTURE: Culture: >=100000 — AB

## 2022-09-11 LAB — CBC
HCT: 40.5 % (ref 36.0–46.0)
Hemoglobin: 13.2 g/dL (ref 12.0–15.0)
MCH: 29.9 pg (ref 26.0–34.0)
MCHC: 32.6 g/dL (ref 30.0–36.0)
MCV: 91.8 fL (ref 80.0–100.0)
Platelets: 311 10*3/uL (ref 150–400)
RBC: 4.41 MIL/uL (ref 3.87–5.11)
RDW: 13 % (ref 11.5–15.5)
WBC: 5.4 10*3/uL (ref 4.0–10.5)
nRBC: 0 % (ref 0.0–0.2)

## 2022-09-11 MED ORDER — HYDRALAZINE HCL 20 MG/ML IJ SOLN: 5.0000 mg | INTRAMUSCULAR | Status: DC | PRN

## 2022-09-11 MED ORDER — ENSURE ENLIVE PO LIQD: 237.0000 mL | Freq: Two times a day (BID) | ORAL | Status: DC

## 2022-09-11 MED ORDER — AMLODIPINE BESYLATE 10 MG PO TABS
10.0000 mg | ORAL_TABLET | Freq: Every day | ORAL | Status: DC
  Administered 2022-09-11: 10 mg via ORAL
  Filled 2022-09-11: qty 1

## 2022-09-11 MED ORDER — ADULT MULTIVITAMIN W/MINERALS CH
1.0000 | ORAL_TABLET | Freq: Every day | ORAL | Status: DC
  Administered 2022-09-11 – 2022-09-12 (×2): 1 via ORAL
  Filled 2022-09-11 (×2): qty 1

## 2022-09-11 NOTE — Evaluation (Signed)
Occupational Therapy Evaluation Patient Details Name: [name] Combs MRN: [id number] DOB: 02-21-1941 Today's Date: 09/11/2022   History of Present Illness 81 y.o. female with medical history significant of HTN and sarcoidosis presenting 09/10/22 with AMS. ?UTI;   Clinical Impression   Pt currently with decreased balance, decreased memory, and decreased safety awareness overall.  Needs min assist for transfers simulated to the toilet as well as grooming tasks in standing and LB selfcare sit to stand.  Will benefit from acute care OT for increased independence with basic selfcare tasks with HHOT at discharge.  Pt will need 24 hr assist at home.  Family will work on figuring out the best options to provide this and request information on available resources.        Recommendations for follow up therapy are one component of a multi-disciplinary discharge planning process, led by the attending physician.  Recommendations may be updated based on patient status, additional functional criteria and insurance authorization.   Follow Up Recommendations  Home health OT    Assistance Recommended at Discharge Frequent or constant Supervision/Assistance  Patient can return home with the following A little help with walking and/or transfers;A little help with bathing/dressing/bathroom;Assistance with cooking/housework;Direct supervision/assist for financial management;Assist for transportation;Help with stairs or ramp for entrance;Direct supervision/assist for medications management    Functional Status Assessment  Patient has had a recent decline in their functional status and demonstrates the ability to make significant improvements in function in a reasonable and predictable amount of time.  Equipment Recommendations  Tub/shower bench       Precautions / Restrictions Precautions Precautions: Fall Restrictions Weight Bearing Restrictions: No      Mobility Bed Mobility Overal bed mobility: Modified  Independent                  Transfers Overall transfer level: Needs assistance Equipment used: None Transfers: Sit to/from Stand, Bed to chair/wheelchair/BSC Sit to Stand: Min assist     Step pivot transfers: Min assist     General transfer comment: Pt with increased right lean during mobility and reaching to hold onto objects to help balance while walking just outside of the door and back.      Balance Overall balance assessment: Needs assistance Sitting-balance support: No upper extremity supported, Feet supported Sitting balance-Leahy Scale: Fair Sitting balance - Comments: No LOB with donning socks EOB.   Standing balance support: No upper extremity supported Standing balance-Leahy Scale: Poor Standing balance comment: Posterior lean as well as right lean with mobility and short choppy steps.                           ADL either performed or assessed with clinical judgement   ADL Overall ADL's : Needs assistance/impaired Eating/Feeding: Independent;Sitting   Grooming: Wash/dry hands;Minimal assistance;Standing   Upper Body Bathing: Set up;Sitting   Lower Body Bathing: Minimal assistance;Sit to/from stand   Upper Body Dressing : Supervision/safety;Sitting   Lower Body Dressing: Minimal assistance;Sit to/from stand   Toilet Transfer: Minimal assistance;Ambulation Toilet Transfer Details (indicate cue type and reason): simulated, pt declined need to toilet Toileting- Clothing Manipulation and Hygiene: Minimal assistance;Sit to/from stand Toileting - Clothing Manipulation Details (indicate cue type and reason): simulated     Functional mobility during ADLs: Minimal assistance (no device) General ADL Comments: Pt's daughter present for eval.  She reports that she lives in [place] but tries to come and visit once a month if possible.  The patient's  son also lives in [place] and is not availble.  Recommend 24 hr supervision at discharge for safety.   Daughter will stay initially and then work on a plan to provide 24 hr.  Pt with decreased awareness of deficits including memory, safety awarness, and balance deficits.     Vision Baseline Vision/History: 1 Wears glasses Ability to See in Adequate Light: 0 Adequate Patient Visual Report: No change from baseline Vision Assessment?: No apparent visual deficits     Perception Perception Perception: Within Functional Limits   Praxis Praxis Praxis: Intact    Pertinent Vitals/Pain Pain Assessment Pain Assessment: No/denies pain     Hand Dominance Right   Extremity/Trunk Assessment Upper Extremity Assessment Upper Extremity Assessment: LUE deficits/detail (Pt with history of left shoulder arthrits and decreased AROM.  Crepitus noted with AAROM 0-120 degrees.  All other joints AROM and strength WFLs) LUE Sensation: WNL LUE Coordination: WNL   Lower Extremity Assessment Lower Extremity Assessment: Defer to PT evaluation   Cervical / Trunk Assessment Cervical / Trunk Assessment: Kyphotic   Communication Communication Communication: No difficulties   Cognition Arousal/Alertness: Awake/alert Behavior During Therapy: WFL for tasks assessed/performed Overall Cognitive Status: Impaired/Different from baseline Area of Impairment: Orientation, Memory, Awareness, Safety/judgement, Problem solving                 Orientation Level: Time, Situation   Memory: Decreased short-term memory (needed mod questioning cueing to recall 1/3 words after one minute delay) Following Commands: Follows one step commands consistently, Follows multi-step commands consistently Safety/Judgement: Decreased awareness of safety, Decreased awareness of deficits Awareness: Intellectual Problem Solving: Requires verbal cues, Slow processing General Comments:  (Pt with decreased awareness of balance deficits and impact on being home alone.  She reports that she has carpet at home and can walk better on  it.)     General Comments  Daughter present and reports she will be with mother short-term (before she returns to [facility])            [place] expects to be discharged to:: Private residence Living Arrangements: Alone Available Help at Discharge: Family (daugher lives in [place] and son lives in [place]) Type of Home: [place] Access: Stairs to enter Technical brewer of Steps: 2 Entrance Stairs-Rails: Left;Right;Can reach both [place]: One level     Bathroom Shower/Tub: Tub/shower unit;Curtain (sponge bathing lately)   Biochemist, clinical: Standard     Home Equipment: BSC/3in1;Cane - quad;Cane - single point;Other (comment) (lift chair)          Prior Functioning/Environment Prior Level of Function : Independent/Modified Independent (family provides some assist on occasion when they are in town but she does most of it)             Mobility Comments: did not use the cane in the house per daughter ADLs Comments: daughter would assist with groceries by ordering and having them delivered but she would still go to the store sometimets and to the post office.        OT Problem List: Impaired balance (sitting and/or standing);Decreased safety awareness;Decreased cognition;Decreased knowledge of use of DME or AE      OT Treatment/Interventions: Self-care/ADL training;Neuromuscular education;DME and/or AE instruction;Therapeutic activities;Balance training;Patient/family education    OT Goals(Current goals can be found in the care plan section) Acute Rehab OT Goals Patient Stated Goal: Pt wants to go home and feels she is OK to be by herself OT Goal Formulation: With patient Time For Goal Achievement: 09/25/22 Potential to  Achieve Goals: Good  OT Frequency: Min 2X/week       AM-PAC OT "6 Clicks" Daily Activity     Outcome Measure Help from another person eating meals?: None Help from another person taking care of personal grooming?: A  Little Help from another person toileting, which includes using toliet, bedpan, or urinal?: A Little Help from another person bathing (including washing, rinsing, drying)?: A Little Help from another person to put on and taking off regular upper body clothing?: A Little Help from another person to put on and taking off regular lower body clothing?: A Little 6 Click Score: 19   End of Session Equipment Utilized During Treatment: Gait belt Nurse Communication: Mobility status  Activity Tolerance: Patient tolerated treatment well Patient left: in chair;with call bell/phone within reach;with family/visitor present;Other (comment) (with PT for next session)  OT Visit Diagnosis: Unsteadiness on feet (R26.81);Muscle weakness (generalized) (M62.81);Other symptoms and signs involving cognitive function                Time: [id number] OT Time Calculation (min): 38 min Charges:  OT General Charges $OT Visit: 1 Visit OT Evaluation $OT Eval Moderate Complexity: 1 Mod OT Treatments $Self Care/Home Management : 23-37 mins Kingslee Mairena OTR/L 09/11/2022, 12:03 PM

## 2022-09-11 NOTE — Progress Notes (Signed)
Physical Therapy Evaluation  Clinical impression: Pt admitted secondary to problem above with deficits below. PTA patient was living alone with family assisting sporadically and from a distance ([place] and [place]). Daughter plans to stay with mother at her home while they make decisions re: the future. Daughter understands patient needs to have someone with her at all times right now and was able to identify that pt remains confused.  Pt currently requires min assist to ambulate with RW x 100 ft and reports fatigue. Attempted without RW and pt reaches for external support consistently and educated that this is not the safest way to reduce fall risk. Anticipate patient will benefit from PT to address problems listed below.Will continue to follow acutely to maximize functional mobility independence and safety.      09/11/22 1046  PT Visit Information  Last PT Received On 09/11/22  Assistance Needed +1  History of Present Illness 81 y.o. female with medical history significant of HTN and sarcoidosis presenting 09/10/22 with AMS. ?UTI;  Precautions  Precautions Fall  Home Living  Family/patient expects to be discharged to: Private residence  Living Arrangements Alone  Available Help at Discharge Family (daugher lives in [place] and son lives in [place])  Type of [place] to enter  Entrance Stairs-Number of Steps 2  Entrance Stairs-Rails Left;Right;Can reach both  [place] One level  Bathroom Shower/Tub Tub/shower unit;Curtain (sponge bathing lately)  [place] BSC/3in1;Cane - quad;Cane - single point;Other (comment) (lift chair)  Prior Function  Prior Level of Function  Independent/Modified Independent (family provides some assist on occasion when they are in town but she does most of it)  Mobility Comments did not use the cane in the house per daughter  ADLs Comments daughter would assist with groceries by ordering and having  them delivered but she would still go to the store sometimets and to the post office.  Communication  Communication No difficulties  Pain Assessment  Pain Assessment No/denies pain  Cognition  Arousal/Alertness Awake/alert  Behavior During Therapy WFL for tasks assessed/performed  Overall Cognitive Status Impaired/Different from baseline  Area of Impairment Orientation;Memory;Following commands;Safety/judgement;Awareness;Problem solving  Orientation Level Time (+month; not day of week)  Memory Decreased short-term memory (could not recall day of week 1 minute after educated)  Following Commands Follows one step commands consistently  Safety/Judgement Decreased awareness of safety;Decreased awareness of deficits  Awareness Intellectual  Problem Solving Slow processing;Difficulty sequencing;Requires verbal cues  General Comments required cues for safe use of RW; reported she has a RW at home but did not tell OT and daughter states she does not have one  Upper Extremity Assessment  Upper Extremity Assessment Defer to OT evaluation  Lower Extremity Assessment  Lower Extremity Assessment Generalized weakness  Cervical / Trunk Assessment  Cervical / Trunk Assessment Kyphotic  Bed Mobility  General bed mobility comments up in chair on arrival  Transfers  Overall transfer level Needs assistance  Equipment used Rolling walker (2 wheels);None  Transfers Sit to/from Stand  Sit to [employer] assist  General transfer comment without device, posterior bias and reaching out to hold onto windowsill; with RW minguard with cues for safe use  Ambulation/Gait  Ambulation/Gait assistance Min assist  Gait Distance (Feet) 40 Feet (seated rest; 100 with RW)  Assistive device Rolling walker (2 wheels);None  Gait Pattern/deviations Step-to pattern;Decreased stance time - left;Decreased stride length;Decreased weight shift to left;Wide base of support  General Gait Details pt with strong right  bias in  walking with very minimal wt-shift over left LE. Denies pain; without device reaching to hold onto external surfaces; with RW tends to push too far ahead and cannot correct with cues  Gait velocity interpretation 1.31 - 2.62 ft/sec, indicative of limited community ambulator  Balance  Overall balance assessment Needs assistance  Sitting-balance support No upper extremity supported;Feet supported  Sitting balance-Leahy Scale Fair  Sitting balance - Comments >fair not tested with PT  Standing balance support No upper extremity supported  Standing balance-Leahy Scale Poor  Standing balance comment posterior bias; unable to step feet closer together or take single step forward and backward without UE support  General Comments  General comments (skin integrity, edema, etc.) Daughter present and reports she will be with mother short-term (before she returns to [place])  PT - End of Session  Equipment Utilized During Treatment Gait belt  Activity Tolerance Patient limited by fatigue  Patient left in chair;with call bell/phone within reach;with chair alarm set;with family/visitor present  Nurse Communication Mobility status;Other (comment) (purewick needs emptying)  PT Assessment  PT Recommendation/Assessment Patient needs continued PT services  PT Visit Diagnosis Unsteadiness on feet (R26.81);Muscle weakness (generalized) (M62.81)  PT Problem List Decreased strength;Decreased activity tolerance;Decreased balance;Decreased mobility;Decreased cognition;Decreased knowledge of use of DME;Decreased safety awareness;Decreased knowledge of precautions  PT Plan  PT Frequency (ACUTE ONLY) Min 3X/week  PT Treatment/Interventions (ACUTE ONLY) DME instruction;Gait training;Stair training;Functional mobility training;Therapeutic activities;Therapeutic exercise;Balance training;Cognitive remediation;Patient/family education  AM-PAC PT "6 Clicks" Mobility Outcome Measure (Version 2)  Help needed turning from your  back to your side while in a flat bed without using bedrails? 4  Help needed moving from lying on your back to sitting on the side of a flat bed without using bedrails? 3  Help needed moving to and from a bed to a chair (including a wheelchair)? 3  Help needed standing up from a chair using your arms (e.g., wheelchair or bedside chair)? 3  Help needed to walk in hospital room? 3  Help needed climbing 3-5 steps with a railing?  3  6 Click Score 19  Consider Recommendation of Discharge To: Home with [facility]  Progressive Mobility  What is the highest level of mobility based on the progressive mobility assessment? Level 3 (Stands with assist) - Balance while standing  and cannot march in place  Activity Ambulated with assistance in hallway  PT Recommendation  Follow Up Recommendations Home health PT  Assistance recommended at discharge Frequent or constant Supervision/Assistance  Patient can return home with the following A little help with walking and/or transfers;Assistance with cooking/housework;Direct supervision/assist for medications management;Direct supervision/assist for financial management;Assist for transportation;Help with stairs or ramp for entrance  Functional Status Assessment Patient has had a recent decline in their functional status and demonstrates the ability to make significant improvements in function in a reasonable and predictable amount of time.  PT equipment Rolling walker (2 wheels)  Individuals Consulted  Consulted and Agree with Results and Recommendations Patient  Acute Rehab PT Goals  Patient Stated Goal return home using cane  PT Goal Formulation With patient  Time For Goal Achievement 09/25/22  Potential to Achieve Goals Good  PT Time Calculation  PT Start Time (ACUTE ONLY) 1019  PT Stop Time (ACUTE ONLY) 1041  PT Time Calculation (min) (ACUTE ONLY) 22 min  PT General Charges  $$ ACUTE PT VISIT 1 Visit  PT Evaluation  $PT Eval Low Complexity 1 Low  Written  Expression  Dominant Hand Right  Arby Barrette, [place]  Office [phone]

## 2022-09-11 NOTE — Evaluation (Signed)
Speech Language Pathology Evaluation Patient Details Name: [name] Combs MRN: [id number] DOB: Sep 24, 1941 Today's Date: 09/11/2022 Time: 1525-1550 SLP Time Calculation (min) (ACUTE ONLY): 25 min  Problem List:  Patient Active Problem List   Diagnosis Date Noted   Altered mental status 09/11/2022   E. coli UTI 09/10/2022   Dementia with behavioral disturbance (HCC) 09/10/2022   Essential hypertension 09/10/2022   Pulmonary sarcoidosis (HCC) 09/10/2022   Acute metabolic encephalopathy 09/09/2022   Past Medical History:  Past Medical History:  Diagnosis Date   Cataract    Hypertension    Sarcoidosis    Past Surgical History: History reviewed. No pertinent surgical history. HPI:  81 y.o. female with medical history significant of HTN and sarcoidosis presenting 09/10/22 with AMS and UTI. Per chart review pt with progressive cognitive decline.   Assessment / Plan / Recommendation Clinical Impression  Pt presents with cognitive deficits (moderate in nature) based upon patients educational level of doctorate degree. Pts son and daughter and law present during evaluation and education with SLP. Administered [facility] Mental Status (13/30). Pt with deficits in delayed and short term recall, temporal disorientation, reduced awareness/insight, decreased executive function skills, and reduced complex problem solving. Pt was unable to complete clock drawing tasks with incorrect numbers and out of sequence. Discussed cognitive deficits and pt hx of some wandering at night time as reviewed in the chart. Recommend 24 hour supervision for patient at DC. Pt would be an excellent candidate for ALF if 24 hour support at home is not possible. SLP to follow during acute stay to assist with continued education and cognitive intervention.    SLP Assessment  SLP Recommendation/Assessment: Patient needs continued Speech Lanaguage Pathology Services SLP Visit Diagnosis: Cognitive communication  deficit (R41.841)    Recommendations for follow up therapy are one component of a multi-disciplinary discharge planning process, led by the attending physician.  Recommendations may be updated based on patient status, additional functional criteria and insurance authorization.    Follow Up Recommendations  Home health SLP    Assistance Recommended at Discharge  Frequent or constant Supervision/Assistance  Functional Status Assessment Patient has had a recent decline in their functional status and demonstrates the ability to make significant improvements in function in a reasonable and predictable amount of time.  Frequency and Duration min 1 x/week  1 week      SLP Evaluation Cognition  Overall Cognitive Status: Impaired/Different from baseline Arousal/Alertness: Awake/alert Orientation Level: Oriented to person;Disoriented to place;Disoriented to time;Disoriented to situation Year: 2023 Month: October Day of Week: Incorrect Attention: Focused;Sustained Focused Attention: Impaired Focused Attention Impairment: Verbal complex;Functional complex Sustained Attention: Impaired Sustained Attention Impairment: Verbal complex;Functional complex Memory: Impaired Memory Impairment: Decreased short term memory;Decreased recall of new information;Prospective memory Decreased Short Term Memory: Verbal complex;Functional complex Awareness: Impaired Awareness Impairment: Emergent impairment Problem Solving: Impaired Problem Solving Impairment: Verbal complex;Functional complex Executive Function: Organizing;Sequencing;Self Monitoring Sequencing: Impaired Sequencing Impairment: Verbal complex Organizing: Impaired Self Monitoring: Impaired Safety/Judgment: Impaired       Comprehension  Auditory Comprehension Overall Auditory Comprehension: Appears within functional limits for tasks assessed    Expression Verbal Expression Overall Verbal Expression: Appears within functional limits for  tasks assessed Written Expression Dominant Hand: Right   Oral / Motor  Oral Motor/Sensory Function Overall Oral Motor/Sensory Function: Within functional limits Motor Speech Overall Motor Speech: Appears within functional limits for tasks assessed            Ardyth Gal MA, CCC-SLP Acute Rehabilitation Services  09/11/2022, 3:59 PM

## 2022-09-11 NOTE — TOC Progression Note (Signed)
Transition of Care [facility]) - Progression Note    Patient Details  Name: [name] Combs MRN: [id number] Date of Birth: [birth year]  Transition of Care [facility]) CM/SW [place], [place] Phone Number: 09/11/2022, 12:18 PM  Clinical Narrative:     CSW received consult to speak with family at bedside. CSW met with pt's daughter and son (via phone). Pt's daughter states she and her brother both have space in their home and the plan is for pt to come and live with them (pt is resistant to this plan). CSW spoke with both at length about what is safest for pt, pt states she can still live alone. Ultimately the family will decide where pt will go but have come to the conclusion that pt cannot continue to live alone. CSW spoke with the family about ALF, SNF, HH options, etc; provided Medicare.gov website for resources. No other questions or concerns voiced.        Expected Discharge Plan and Services                                                 Social Determinants of Health (SDOH) Interventions    Readmission Risk Interventions     No data to display

## 2022-09-11 NOTE — Plan of Care (Signed)

## 2022-09-11 NOTE — Progress Notes (Signed)
Initial Nutrition Assessment  DOCUMENTATION CODES:   Not applicable  INTERVENTION:   -Ensure Enlive po BID, each supplement provides 350 kcal and 20 grams of protein -MVI with minerals  NUTRITION DIAGNOSIS:   Increased nutrient needs related to chronic illness (sarcoidosis) as evidenced by estimated needs.  GOAL:   Patient will meet greater than or equal to 90% of their needs  MONITOR:   PO intake, Supplement acceptance  REASON FOR ASSESSMENT:   Consult Assessment of nutrition requirement/status  ASSESSMENT:   Pt with medical history significant of HTN and sarcoidosis presenting with AMS.  Pt admitted with acute metabolic encephalopathy.   Reviewed I/O's: -200 ml x 24 hours  UOP: 200 ml x 24 hours   Pt unavailable at time of visit. Attempted to speak with pt via call to hospital room phone, however, unable to reach. RD unable to obtain further nutrition-related history or complete nutrition-focused physical exam at this time.    Per chart review, pt remains confused.  Pt is currently on a regular diet. No meal completion data available to assess at this time.   No wt hx available to assess wt changes at this time.   Per therapy notes, pt plans to return home with family at discharge.  Medications reviewed and include colace.  Labs reviewed.   Diet Order:   Diet Order             Diet regular Room service appropriate? Yes; Fluid consistency: Thin  Diet effective now                   EDUCATION NEEDS:   No education needs have been identified at this time  Skin:  Skin Assessment: Reviewed RN Assessment  Last BM:  09/11/22  Height:   Ht Readings from Last 1 Encounters:  09/10/22 5\' 4"  (1.626 m)    Weight:   Wt Readings from Last 1 Encounters:  09/10/22 54 kg    Ideal Body Weight:  54.5 kg  BMI:  Body mass index is 20.43 kg/m.  Estimated Nutritional Needs:   Kcal:  1450-1650  Protein:  70-85 grams  Fluid:  > 1.4  L    Loistine Chance, RD, LDN, [place] Registered Dietitian II Certified Diabetes Care and Education Specialist Please refer to [facility] for RD and/or RD on-call/weekend/after hours pager

## 2022-09-11 NOTE — Progress Notes (Addendum)
PROGRESS NOTE        PATIENT DETAILS Name: [name] Combs Age: 81 y.o. Sex: female Date of Birth: [birth year] Admit Date: 09/09/2022 Admitting Physician Charlsie Quest, MD [id number], Jonny Ruiz, MD  Brief Summary: Patient is a 81 y.o.  female dementia/cognitive dysfunction-who lives alone-presenting with confusion.  She apparently drove to her grocery store-and apparently got lost and came back home after more than 12 hours.  She was found to have a UTI and subsequently admitted to the hospitalist service.  Significant events: 9/28>> confusion-UTI-admit to [facility]  Significant studies: 9/28>> CXR: No PNA 9/28>> CT head: No acute abnormalities-atrophy with small vessel chronic ischemic changes.  Significant microbiology data: 9/28>> COVID/influenza PCR: Negative 9/28>> urine culture: E. coli  Procedures: None  Consults: None  Subjective: Answering questions appropriately-but still somewhat confused.  Objective: Vitals: Blood pressure (!) 171/95, pulse 99, temperature 99.5 F (37.5 C), temperature source Oral, resp. rate 20, height 5\' 4"  (1.626 m), weight 54 kg, SpO2 99 %.   Exam: Gen Exam:not in any distress HEENT:atraumatic, normocephalic Chest: B/L clear to auscultation anteriorly CVS:S1S2 regular Abdomen:soft non tender, non distended Extremities:no edema Neurology: Non focal Skin: no rash  Pertinent Labs/Radiology:    Latest Ref Rng & Units 09/11/2022    7:23 AM 09/09/2022    3:30 PM  CBC  WBC 4.0 - 10.5 K/uL 5.4  4.9   Hemoglobin 12.0 - 15.0 g/dL 09/11/2022  32.3   Hematocrit 36.0 - 46.0 % 40.5  39.7   Platelets 150 - 400 K/uL 311  330     Lab Results  Component Value Date   NA 140 09/11/2022   K 3.9 09/11/2022   CL 103 09/11/2022   CO2 28 09/11/2022      Assessment/Plan: Acute metabolic encephalopathy due to complicated UTI-superimposed on dementia Mentation improving-still not yet at baseline-remains somewhat confused-daughter at  bedside.  Clearly not safe for discharge as she lives alone. Continue IV antibiotics-transition to oral antimicrobial therapy in a few days.  HTN BP uncontrolled-increase amlodipine to 10 mg.   Follow/optimize  Dementia Supportive care-maintain delirium precautions Family advised that it is likely not safe for her to drive anymore. Likely will require placement-given that she lives alone (was driving for more than 12 hours after she went to her local grocery store and got lost and came back home at 2 AM).  Social 09/13/2022.  Sarcoidosis Supportive care  Debility/deconditioning PT/OT eval  BMI: Estimated body mass index is 20.43 kg/m as calculated from the following:   Height as of this encounter: 5\' 4"  (1.626 m).   Weight as of this encounter: 54 kg.   Code status:   Code Status: Full Code   DVT Prophylaxis: enoxaparin (LOVENOX) injection 40 mg Start: 09/10/22 1900   Family Communication:  Daughter at bedside  Disposition Plan: Status is: Observation The patient remains OBS appropriate and will d/c before 2 midnights.   Planned Discharge Destination:Skilled nursing facility   Diet: Diet Order             Diet regular Room service appropriate? Yes; Fluid consistency: Thin  Diet effective now                     Antimicrobial agents: Anti-infectives (From admission, onward)    Start     Dose/Rate Route Frequency Ordered Stop  09/10/22 1900  cefTRIAXone (ROCEPHIN) 1 g in sodium chloride 0.9 % 100 mL IVPB        1 g 200 mL/hr over 30 Minutes Intravenous Every 24 hours 09/10/22 1810     09/09/22 1845  cefTRIAXone (ROCEPHIN) 1 g in sodium chloride 0.9 % 100 mL IVPB        1 g 200 mL/hr over 30 Minutes Intravenous  Once 09/09/22 1832 09/09/22 2030        MEDICATIONS: Scheduled Meds:  amLODipine  5 mg Oral QHS   docusate sodium  100 mg Oral BID   enoxaparin (LOVENOX) injection  40 mg Subcutaneous Q24H   Continuous Infusions:  cefTRIAXone  (ROCEPHIN)  IV 1 g (09/10/22 1908)   lactated ringers Stopped (09/10/22 1908)   PRN Meds:.acetaminophen **OR** acetaminophen, albuterol, bisacodyl, hydrALAZINE, ondansetron **OR** ondansetron (ZOFRAN) IV, polyethylene glycol, traZODone   I have personally reviewed following labs and imaging studies  LABORATORY DATA: CBC: Recent Labs  Lab 09/09/22 1530 09/11/22 0723  WBC 4.9 5.4  NEUTROABS 3.2  --   HGB 12.7 13.2  HCT 39.7 40.5  MCV 92.3 91.8  PLT 330 564    Basic Metabolic Panel: Recent Labs  Lab 09/09/22 1530 09/11/22 0723  NA 140 140  K 4.4 3.9  CL 101 103  CO2 29 28  GLUCOSE 73 94  BUN 12 10  CREATININE 0.60 0.65  CALCIUM 10.0 9.5    GFR: Estimated Creatinine Clearance: 47 mL/min (by C-G formula based on SCr of 0.65 mg/dL).  Liver Function Tests: Recent Labs  Lab 09/09/22 1530  AST 17  ALT 6  ALKPHOS 80  BILITOT 0.9  PROT 7.7  ALBUMIN 3.8   No results for input(s): "LIPASE", "AMYLASE" in the last 168 hours. Recent Labs  Lab 09/09/22 1530  AMMONIA 22    Coagulation Profile: No results for input(s): "INR", "PROTIME" in the last 168 hours.  Cardiac Enzymes: No results for input(s): "CKTOTAL", "CKMB", "CKMBINDEX", "TROPONINI" in the last 168 hours.  BNP (last 3 results) No results for input(s): "PROBNP" in the last 8760 hours.  Lipid Profile: No results for input(s): "CHOL", "HDL", "LDLCALC", "TRIG", "CHOLHDL", "LDLDIRECT" in the last 72 hours.  Thyroid Function Tests: Recent Labs    09/09/22 1530  TSH 2.348    Anemia Panel: No results for input(s): "VITAMINB12", "FOLATE", "FERRITIN", "TIBC", "IRON", "RETICCTPCT" in the last 72 hours.  Urine analysis:    Component Value Date/Time   COLORURINE YELLOW 09/09/2022 1654   APPEARANCEUR CLEAR 09/09/2022 1654   LABSPEC 1.015 09/09/2022 1654   PHURINE 7.0 09/09/2022 1654   GLUCOSEU NEGATIVE 09/09/2022 1654   HGBUR NEGATIVE 09/09/2022 1654   BILIRUBINUR NEGATIVE 09/09/2022 1654    KETONESUR TRACE (A) 09/09/2022 1654   PROTEINUR NEGATIVE 09/09/2022 1654   NITRITE NEGATIVE 09/09/2022 1654   LEUKOCYTESUR SMALL (A) 09/09/2022 1654    Sepsis Labs: Lactic Acid, Venous No results found for: "LATICACIDVEN"  MICROBIOLOGY: Recent Results (from the past 240 hour(s))  Resp Panel by RT-PCR (Flu A&B, Covid) Anterior Nasal Swab     Status: None   Collection Time: 09/09/22  3:00 PM   Specimen: Anterior Nasal Swab  Result Value Ref Range Status   SARS Coronavirus 2 by RT PCR NEGATIVE NEGATIVE Final    Comment: (NOTE) SARS-CoV-2 target nucleic acids are NOT DETECTED.  The SARS-CoV-2 RNA is generally detectable in upper respiratory specimens during the acute phase of infection. The lowest concentration of SARS-CoV-2 viral copies this assay can detect is  138 copies/mL. A negative result does not preclude SARS-Cov-2 infection and should not be used as the sole basis for treatment or other patient management decisions. A negative result may occur with  improper specimen collection/handling, submission of specimen other than nasopharyngeal swab, presence of viral mutation(s) within the areas targeted by this assay, and inadequate number of viral copies(<138 copies/mL). A negative result must be combined with clinical observations, patient history, and epidemiological information. The expected result is Negative.  Fact Sheet for Patients:  BloggerCourse.com  Fact Sheet for Healthcare Providers:  SeriousBroker.it  This test is no t yet approved or cleared by the [place] FDA and  has been authorized for detection and/or diagnosis of SARS-CoV-2 by FDA under an Emergency Use Authorization (EUA). This EUA will remain  in effect (meaning this test can be used) for the duration of the COVID-19 declaration under Section 564(b)(1) of the Act, 21 U.S.C.section 360bbb-3(b)(1), unless the authorization is terminated  or revoked  sooner.       Influenza A by PCR NEGATIVE NEGATIVE Final   Influenza B by PCR NEGATIVE NEGATIVE Final    Comment: (NOTE) The Xpert Xpress SARS-CoV-2/FLU/RSV plus assay is intended as an aid in the diagnosis of influenza from Nasopharyngeal swab specimens and should not be used as a sole basis for treatment. Nasal washings and aspirates are unacceptable for Xpert Xpress SARS-CoV-2/FLU/RSV testing.  Fact Sheet for Patients: BloggerCourse.com  Fact Sheet for Healthcare Providers: SeriousBroker.it  This test is not yet approved or cleared by the [place] FDA and has been authorized for detection and/or diagnosis of SARS-CoV-2 by FDA under an Emergency Use Authorization (EUA). This EUA will remain in effect (meaning this test can be used) for the duration of the COVID-19 declaration under Section 564(b)(1) of the Act, 21 U.S.C. section 360bbb-3(b)(1), unless the authorization is terminated or revoked.  Performed at [facility], [address] [place], [place] [address]   Urine Culture     Status: Abnormal   Collection Time: 09/09/22  4:54 PM   Specimen: Urine, Clean Catch  Result Value Ref Range Status   Specimen Description   Final    URINE, CLEAN CATCH Performed at [facility], [address], [place], [place] [address]    Special Requests   Final    NONE Performed at [facility], [address] [place], [place] [address]    Culture >=100,000 COLONIES/mL ESCHERICHIA COLI (A)  Final   Report Status 09/11/2022 FINAL  Final   Organism ID, Bacteria ESCHERICHIA COLI (A)  Final      Susceptibility   Escherichia coli - MIC*    AMPICILLIN 16 INTERMEDIATE Intermediate     CEFAZOLIN <=4 SENSITIVE Sensitive     CEFEPIME <=0.12 SENSITIVE Sensitive     CEFTRIAXONE <=0.25 SENSITIVE Sensitive     CIPROFLOXACIN <=0.25 SENSITIVE Sensitive     GENTAMICIN <=1 SENSITIVE  Sensitive     IMIPENEM <=0.25 SENSITIVE Sensitive     NITROFURANTOIN <=16 SENSITIVE Sensitive     TRIMETH/SULFA <=20 SENSITIVE Sensitive     AMPICILLIN/SULBACTAM 4 SENSITIVE Sensitive     PIP/TAZO <=4 SENSITIVE Sensitive     * >=100,000 COLONIES/mL ESCHERICHIA COLI    RADIOLOGY STUDIES/RESULTS: CT Head Wo Contrast  Result Date: 09/09/2022 CLINICAL DATA:  Delirium, confusion EXAM: CT HEAD WITHOUT CONTRAST TECHNIQUE: Contiguous axial images were obtained from the base of the skull through the vertex without intravenous contrast. RADIATION DOSE REDUCTION: This exam was performed according to the departmental  dose-optimization program which includes automated exposure control, adjustment of the mA and/or kV according to patient size and/or use of iterative reconstruction technique. COMPARISON:  Delirium, confusion FINDINGS: Brain: Generalized atrophy. Normal ventricular morphology. No midline shift or mass effect. Small vessel chronic ischemic changes of deep cerebral white matter. No intracranial hemorrhage, mass lesion, evidence of acute infarction, or extra-axial fluid collection. Vascular: No hyperdense vessels. Atherosclerotic calcifications present at internal carotid arteries bilaterally at skull base Skull: Intact Sinuses/Orbits: Clear Other: N/A IMPRESSION: Atrophy with small vessel chronic ischemic changes of deep cerebral white matter. No acute intracranial abnormalities. Electronically Signed   By: Ulyses Southward M.D.   On: 09/09/2022 16:12   DG Chest 1 View  Result Date: 09/09/2022 CLINICAL DATA:  Chest pain EXAM: CHEST  1 VIEW COMPARISON:  Chest 01/24/2004 FINDINGS: Heart size and vascularity normal. Atherosclerotic calcification aortic arch Mild atelectasis in both bases. No acute infiltrate or effusion. No acute skeletal abnormality. IMPRESSION: Mild bibasilar atelectasis. Electronically Signed   By: Marlan Palau M.D.   On: 09/09/2022 14:57     LOS: 0 days   Jeoffrey Massed, MD  [facility]    To contact the attending provider between 7A-7P or the covering provider during after hours 7P-7A, please log into the web site [email or web address] and access using universal [place] password for that web site. If you do not have the password, please call the hospital operator.  09/11/2022, 10:46 AM

## 2022-09-12 DIAGNOSIS — F03918 Unspecified dementia, unspecified severity, with other behavioral disturbance: Secondary | ICD-10-CM | POA: Diagnosis not present

## 2022-09-12 DIAGNOSIS — B9620 Unspecified Escherichia coli [E. coli] as the cause of diseases classified elsewhere: Secondary | ICD-10-CM

## 2022-09-12 DIAGNOSIS — N390 Urinary tract infection, site not specified: Secondary | ICD-10-CM | POA: Diagnosis not present

## 2022-09-12 DIAGNOSIS — G9341 Metabolic encephalopathy: Secondary | ICD-10-CM | POA: Diagnosis not present

## 2022-09-12 MED ORDER — CEFDINIR 300 MG PO CAPS
300.0000 mg | ORAL_CAPSULE | Freq: Two times a day (BID) | ORAL | Status: DC
  Administered 2022-09-12: 300 mg via ORAL
  Filled 2022-09-12: qty 1

## 2022-09-12 MED ORDER — AMLODIPINE BESYLATE 10 MG PO TABS: 10.0000 mg | ORAL_TABLET | Freq: Every day | ORAL | 2 refills | Status: AC

## 2022-09-12 MED ORDER — CEFDINIR 300 MG PO CAPS: 300.0000 mg | ORAL_CAPSULE | Freq: Two times a day (BID) | ORAL | 0 refills | Status: DC

## 2022-09-12 NOTE — Discharge Summary (Signed)
PATIENT DETAILS Name: [name] Combs Age: 81 y.o. Sex: female Date of Birth: 24-Jan-1941 MRN: [id number]. Admitting Physician: Charlsie Quest, MD [id number], Jonny Ruiz, MD  Admit Date: 09/09/2022 Discharge date: 09/12/2022  Recommendations for Outpatient Follow-up:  Follow up with PCP in 1-2 weeks Please obtain CMP/CBC in one week  Admitted From:  Home  Disposition: Home   Discharge Condition: good  CODE STATUS:   Code Status: Full Code   Diet recommendation:  Diet Order             Diet - low sodium heart healthy           Diet regular Room service appropriate? Yes; Fluid consistency: Thin  Diet effective now                    Brief Summary: Patient is a 81 y.o.  female dementia/cognitive dysfunction-who lives alone-presenting with confusion.  She apparently drove to her grocery store-and apparently got lost and came back home after more than 12 hours.  She was found to have a UTI and subsequently admitted to the hospitalist service.   Significant events: 9/28>> confusion-UTI-admit to [facility]   Significant studies: 9/28>> CXR: No PNA 9/28>> CT head: No acute abnormalities-atrophy with small vessel chronic ischemic changes.   Significant microbiology data: 9/28>> COVID/influenza PCR: Negative 9/28>> urine culture: E. coli   Procedures: None   Consults: None   Brief Hospital Course: Acute metabolic encephalopathy due to complicated UTI-superimposed on dementia Overall much better after treatment of underlying UTI-appears to have cognitive dysfunction at baseline-daughter at bedside-improved-now and close to baseline.  Transition to oral antimicrobial therapy-E. coli is pansensitive.   HTN BP better-continue amlodipine.   PCP to follow/optimize   Dementia Supportive care-maintain delirium precautions Family advised that it is likely not safe for her to drive anymore. Appreciate social worker input-family history to take her home with home health services.    Sarcoidosis Supportive care   Debility/deconditioning Appreciate PT/OT eval-family prefers home health   BMI: Estimated body mass index is 20.43 kg/m as calculated from the following:   Height as of this encounter: 5\' 4"  (1.626 m).   Weight as of this encounter: 54 kg.   Nutrition Status: Nutrition Problem: Increased nutrient needs Etiology: chronic illness (sarcoidosis) Signs/Symptoms: estimated needs Interventions: Ensure Enlive (each supplement provides 350kcal and 20 grams of protein), MVI    Discharge Diagnoses:  Principal Problem:   Acute metabolic encephalopathy Active Problems:   E. coli UTI   Dementia with behavioral disturbance (HCC)   Essential hypertension   Pulmonary sarcoidosis (HCC)   Altered mental status   Discharge Instructions:  Activity:  As tolerated with Full fall precautions use walker/cane & assistance as needed Discharge Instructions     Call MD for:  difficulty breathing, headache or visual disturbances   Complete by: As directed    Call MD for:  persistant nausea and vomiting   Complete by: As directed    Call MD for:  severe uncontrolled pain   Complete by: As directed    Diet - low sodium heart healthy   Complete by: As directed    Discharge instructions   Complete by: As directed    Follow with Primary MD  , MD in 1-2 weeks  Please get a complete blood count and chemistry panel checked by your Primary MD at your next visit, and again as instructed by your Primary MD.  Get Medicines reviewed and adjusted: Please take all your  medications with you for your next visit with your Primary MD  Laboratory/radiological data: Please request your Primary MD to go over all hospital tests and procedure/radiological results at the follow up, please ask your Primary MD to get all Hospital records sent to his/her office.  In some cases, they will be blood work, cultures and biopsy results pending at the time of your discharge. Please  request that your primary care M.D. follows up on these results.  Also Note the following: If you experience worsening of your admission symptoms, develop shortness of breath, life threatening emergency, suicidal or homicidal thoughts you must seek medical attention immediately by calling 911 or calling your MD immediately  if symptoms less severe.  You must read complete instructions/literature along with all the possible adverse reactions/side effects for all the Medicines you take and that have been prescribed to you. Take any new Medicines after you have completely understood and accpet all the possible adverse reactions/side effects.   Do not drive when taking Pain medications or sleeping medications (Benzodaizepines)  Do not take more than prescribed Pain, Sleep and Anxiety Medications. It is not advisable to combine anxiety,sleep and pain medications without talking with your primary care practitioner  Special Instructions: If you have smoked or chewed Tobacco  in the last 2 yrs please stop smoking, stop any regular Alcohol  and or any Recreational drug use.  Wear Seat belts while driving.  Please note: You were cared for by a hospitalist during your hospital stay. Once you are discharged, your primary care physician will handle any further medical issues. Please note that NO REFILLS for any discharge medications will be authorized once you are discharged, as it is imperative that you return to your primary care physician (or establish a relationship with a primary care physician if you do not have one) for your post hospital discharge needs so that they can reassess your need for medications and monitor your lab values.   It is recommended that you refrain from operating a motor vehicle.   Increase activity slowly   Complete by: As directed       Allergies as of 09/12/2022       Reactions   Codeine Cough   Sesame Seed (diagnostic) Hives        Medication List     TAKE these  medications    amLODipine 10 MG tablet Commonly known as: NORVASC Take 1 tablet (10 mg total) by mouth at bedtime. What changed:  medication strength how much to take   cefdinir 300 MG capsule Commonly known as: OMNICEF Take 1 capsule (300 mg total) by mouth every 12 (twelve) hours.               Durable Medical Equipment  (From admission, onward)           Start     Ordered   09/12/22 0855  For home use only DME Walker rolling  Once       Question Answer Comment  Walker: With 5 Inch Wheels   Patient needs a walker to treat with the following condition Physical deconditioning      09/12/22 0855            Follow-up Information     [facility] Follow up.   Specialty: [place] Why: for home health services Contact information: [place] [address] [phone]         Shon Baton, MD. Schedule an  appointment as soon as possible for a visit in 1 week(s).   Specialty: Internal Medicine Contact information: [address] [place] [id number] [phone]                Allergies  Allergen Reactions   Codeine Cough   Sesame Seed (Diagnostic) Hives     Other Procedures/Studies: CT Head Wo Contrast  Result Date: 09/09/2022 CLINICAL DATA:  Delirium, confusion EXAM: CT HEAD WITHOUT CONTRAST TECHNIQUE: Contiguous axial images were obtained from the base of the skull through the vertex without intravenous contrast. RADIATION DOSE REDUCTION: This exam was performed according to the departmental dose-optimization program which includes automated exposure control, adjustment of the mA and/or kV according to patient size and/or use of iterative reconstruction technique. COMPARISON:  Delirium, confusion FINDINGS: Brain: Generalized atrophy. Normal ventricular morphology. No midline shift or mass effect. Small vessel chronic ischemic changes of deep cerebral white matter. No intracranial hemorrhage,  mass lesion, evidence of acute infarction, or extra-axial fluid collection. Vascular: No hyperdense vessels. Atherosclerotic calcifications present at internal carotid arteries bilaterally at skull base Skull: Intact Sinuses/Orbits: Clear Other: N/A IMPRESSION: Atrophy with small vessel chronic ischemic changes of deep cerebral white matter. No acute intracranial abnormalities. Electronically Signed   By: Ulyses SouthwardMark  Boles M.D.   On: 09/09/2022 16:12   DG Chest 1 View  Result Date: 09/09/2022 CLINICAL DATA:  Chest pain EXAM: CHEST  1 VIEW COMPARISON:  Chest 01/24/2004 FINDINGS: Heart size and vascularity normal. Atherosclerotic calcification aortic arch Mild atelectasis in both bases. No acute infiltrate or effusion. No acute skeletal abnormality. IMPRESSION: Mild bibasilar atelectasis. Electronically Signed   By: Marlan Palauharles  Clark M.D.   On: 09/09/2022 14:57     TODAY-DAY OF DISCHARGE:  Subjective:   [name] AranLinda Brener today has no headache,no chest abdominal pain,no new weakness tingling or numbness, feels much better wants to go home today.   Objective:   Blood pressure 113/81, pulse 94, temperature 97.8 F (36.6 C), temperature source Oral, resp. rate 18, height 5\' 4"  (1.626 m), weight 54 kg, SpO2 95 %.  Intake/Output Summary (Last 24 hours) at 09/12/2022 0940 Last data filed at 09/11/2022 1100 Gross per 24 hour  Intake --  Output 900 ml  Net -900 ml   Filed Weights   09/09/22 1123 09/10/22 1600  Weight: 54.4 kg 54 kg    Exam: Awake Alert, Oriented *3, No new F.N deficits, Normal affect Stevens.AT,PERRAL Supple Neck,No JVD, No cervical lymphadenopathy appriciated.  Symmetrical Chest wall movement, Good air movement bilaterally, CTAB RRR,No Gallops,Rubs or new Murmurs, No Parasternal Heave +ve B.Sounds, Abd Soft, Non tender, No organomegaly appriciated, No rebound -guarding or rigidity. No Cyanosis, Clubbing or edema, No new Rash or bruise   PERTINENT RADIOLOGIC STUDIES: No results  found.   PERTINENT LAB RESULTS: CBC: Recent Labs    09/09/22 1530 09/11/22 0723  WBC 4.9 5.4  HGB 12.7 13.2  HCT 39.7 40.5  PLT 330 311   CMET CMP     Component Value Date/Time   NA 140 09/11/2022 0723   K 3.9 09/11/2022 0723   CL 103 09/11/2022 0723   CO2 28 09/11/2022 0723   GLUCOSE 94 09/11/2022 0723   BUN 10 09/11/2022 0723   CREATININE 0.65 09/11/2022 0723   CALCIUM 9.5 09/11/2022 0723   PROT 7.7 09/09/2022 1530   ALBUMIN 3.8 09/09/2022 1530   AST 17 09/09/2022 1530   ALT 6 09/09/2022 1530   ALKPHOS 80 09/09/2022 1530   BILITOT 0.9 09/09/2022 1530  GFRNONAA >60 09/11/2022 0723    GFR Estimated Creatinine Clearance: 47 mL/min (by C-G formula based on SCr of 0.65 mg/dL). No results for input(s): "LIPASE", "AMYLASE" in the last 72 hours. No results for input(s): "CKTOTAL", "CKMB", "CKMBINDEX", "TROPONINI" in the last 72 hours. Invalid input(s): "POCBNP" No results for input(s): "DDIMER" in the last 72 hours. No results for input(s): "HGBA1C" in the last 72 hours. No results for input(s): "CHOL", "HDL", "LDLCALC", "TRIG", "CHOLHDL", "LDLDIRECT" in the last 72 hours. Recent Labs    09/09/22 1530  TSH 2.348   No results for input(s): "VITAMINB12", "FOLATE", "FERRITIN", "TIBC", "IRON", "RETICCTPCT" in the last 72 hours. Coags: No results for input(s): "INR" in the last 72 hours.  Invalid input(s): "PT" Microbiology: Recent Results (from the past 240 hour(s))  Resp Panel by RT-PCR (Flu A&B, Covid) Anterior Nasal Swab     Status: None   Collection Time: 09/09/22  3:00 PM   Specimen: Anterior Nasal Swab  Result Value Ref Range Status   SARS Coronavirus 2 by RT PCR NEGATIVE NEGATIVE Final    Comment: (NOTE) SARS-CoV-2 target nucleic acids are NOT DETECTED.  The SARS-CoV-2 RNA is generally detectable in upper respiratory specimens during the acute phase of infection. The lowest concentration of SARS-CoV-2 viral copies this assay can detect is 138 copies/mL.  A negative result does not preclude SARS-Cov-2 infection and should not be used as the sole basis for treatment or other patient management decisions. A negative result may occur with  improper specimen collection/handling, submission of specimen other than nasopharyngeal swab, presence of viral mutation(s) within the areas targeted by this assay, and inadequate number of viral copies(<138 copies/mL). A negative result must be combined with clinical observations, patient history, and epidemiological information. The expected result is Negative.  Fact Sheet for Patients:  BloggerCourse.com  Fact Sheet for Healthcare Providers:  SeriousBroker.it  This test is no t yet approved or cleared by the [place] FDA and  has been authorized for detection and/or diagnosis of SARS-CoV-2 by FDA under an Emergency Use Authorization (EUA). This EUA will remain  in effect (meaning this test can be used) for the duration of the COVID-19 declaration under Section 564(b)(1) of the Act, 21 U.S.C.section 360bbb-3(b)(1), unless the authorization is terminated  or revoked sooner.       Influenza A by PCR NEGATIVE NEGATIVE Final   Influenza B by PCR NEGATIVE NEGATIVE Final    Comment: (NOTE) The Xpert Xpress SARS-CoV-2/FLU/RSV plus assay is intended as an aid in the diagnosis of influenza from Nasopharyngeal swab specimens and should not be used as a sole basis for treatment. Nasal washings and aspirates are unacceptable for Xpert Xpress SARS-CoV-2/FLU/RSV testing.  Fact Sheet for Patients: BloggerCourse.com  Fact Sheet for Healthcare Providers: SeriousBroker.it  This test is not yet approved or cleared by the [place] FDA and has been authorized for detection and/or diagnosis of SARS-CoV-2 by FDA under an Emergency Use Authorization (EUA). This EUA will remain in effect (meaning this test  can be used) for the duration of the COVID-19 declaration under Section 564(b)(1) of the Act, 21 U.S.C. section 360bbb-3(b)(1), unless the authorization is terminated or revoked.  Performed at [facility], [address] [place], [place] [address]   Urine Culture     Status: Abnormal   Collection Time: 09/09/22  4:54 PM   Specimen: Urine, Clean Catch  Result Value Ref Range Status   Specimen Description   Final    URINE, CLEAN CATCH Performed at  [facility], [address] [place], [place] [address]    Special Requests   Final    NONE Performed at [facility], [address], [place], [place] [address]    Culture >=100,000 COLONIES/mL ESCHERICHIA COLI (A)  Final   Report Status 09/11/2022 FINAL  Final   Organism ID, Bacteria ESCHERICHIA COLI (A)  Final      Susceptibility   Escherichia coli - MIC*    AMPICILLIN 16 INTERMEDIATE Intermediate     CEFAZOLIN <=4 SENSITIVE Sensitive     CEFEPIME <=0.12 SENSITIVE Sensitive     CEFTRIAXONE <=0.25 SENSITIVE Sensitive     CIPROFLOXACIN <=0.25 SENSITIVE Sensitive     GENTAMICIN <=1 SENSITIVE Sensitive     IMIPENEM <=0.25 SENSITIVE Sensitive     NITROFURANTOIN <=16 SENSITIVE Sensitive     TRIMETH/SULFA <=20 SENSITIVE Sensitive     AMPICILLIN/SULBACTAM 4 SENSITIVE Sensitive     PIP/TAZO <=4 SENSITIVE Sensitive     * >=100,000 COLONIES/mL ESCHERICHIA COLI    FURTHER DISCHARGE INSTRUCTIONS:  Get Medicines reviewed and adjusted: Please take all your medications with you for your next visit with your Primary MD  Laboratory/radiological data: Please request your Primary MD to go over all hospital tests and procedure/radiological results at the follow up, please ask your Primary MD to get all Hospital records sent to his/her office.  In some cases, they will be blood work, cultures and biopsy results pending at the time of your discharge. Please request that your primary  care M.D. goes through all the records of your hospital data and follows up on these results.  Also Note the following: If you experience worsening of your admission symptoms, develop shortness of breath, life threatening emergency, suicidal or homicidal thoughts you must seek medical attention immediately by calling 911 or calling your MD immediately  if symptoms less severe.  You must read complete instructions/literature along with all the possible adverse reactions/side effects for all the Medicines you take and that have been prescribed to you. Take any new Medicines after you have completely understood and accpet all the possible adverse reactions/side effects.   Do not drive when taking Pain medications or sleeping medications (Benzodaizepines)  Do not take more than prescribed Pain, Sleep and Anxiety Medications. It is not advisable to combine anxiety,sleep and pain medications without talking with your primary care practitioner  Special Instructions: If you have smoked or chewed Tobacco  in the last 2 yrs please stop smoking, stop any regular Alcohol  and or any Recreational drug use.  Wear Seat belts while driving.  Please note: You were cared for by a hospitalist during your hospital stay. Once you are discharged, your primary care physician will handle any further medical issues. Please note that NO REFILLS for any discharge medications will be authorized once you are discharged, as it is imperative that you return to your primary care physician (or establish a relationship with a primary care physician if you do not have one) for your post hospital discharge needs so that they can reassess your need for medications and monitor your lab values.  Total Time spent coordinating discharge including counseling, education and face to face time equals greater than 30 minutes.  SignedJeoffrey Massed 09/12/2022 9:40 AM

## 2022-09-12 NOTE — TOC Transition Note (Signed)
Transition of Care [facility]) - CM/SW Discharge Note   Patient Details  Name: [name] Combs MRN: [id number] Date of Birth: 14-May-1941  [facility] CM/SW Contact:  Carles Collet, RN Phone Number: 09/12/2022, 9:35 AM   Clinical Narrative:     [name] w daughter Altha Harm on the phone. DME RW to be delivered to the room. 3/1- Chrisitine will research on [employer] and order device for shower. Patient has 3/1 at home currently that can be used in the interim. [place] Referral accepted by Alvis Lemmings  Family will transport home  Provided with resources for [facility] for future estate/ custodial planning    Final next level of care: [place] Barriers to Discharge: No Barriers Identified   Patient Goals and CMS Choice Patient states their goals for this hospitalization and ongoing recovery are:: to go home CMS Medicare.gov Compare Post Acute Care list provided to:: Other (Comment Required) Choice offered to / list presented to : Adult Children  Discharge Placement                       Discharge Plan and Services                DME Arranged: Walker rolling DME Agency: [employer] Date DME Agency Contacted: 09/12/22 Time DME Agency Contacted: [phone] Representative spoke with at DME Agency: [place]: PT, OT, Speech Therapy [place] Agency: [place]        Social Determinants of Health (SDOH) Interventions     Readmission Risk Interventions     No data to display

## 2022-09-15 DIAGNOSIS — G319 Degenerative disease of nervous system, unspecified: Secondary | ICD-10-CM | POA: Diagnosis not present

## 2022-09-15 DIAGNOSIS — N390 Urinary tract infection, site not specified: Secondary | ICD-10-CM | POA: Diagnosis not present

## 2022-09-15 DIAGNOSIS — G9341 Metabolic encephalopathy: Secondary | ICD-10-CM | POA: Diagnosis not present

## 2022-09-15 DIAGNOSIS — M25562 Pain in left knee: Secondary | ICD-10-CM | POA: Diagnosis not present

## 2022-09-15 DIAGNOSIS — B9620 Unspecified Escherichia coli [E. coli] as the cause of diseases classified elsewhere: Secondary | ICD-10-CM | POA: Diagnosis not present

## 2022-09-15 DIAGNOSIS — Z9183 Wandering in diseases classified elsewhere: Secondary | ICD-10-CM | POA: Diagnosis not present

## 2022-09-15 DIAGNOSIS — I119 Hypertensive heart disease without heart failure: Secondary | ICD-10-CM | POA: Diagnosis not present

## 2022-09-15 DIAGNOSIS — D860 Sarcoidosis of lung: Secondary | ICD-10-CM | POA: Diagnosis not present

## 2022-09-15 DIAGNOSIS — F02818 Dementia in other diseases classified elsewhere, unspecified severity, with other behavioral disturbance: Secondary | ICD-10-CM | POA: Diagnosis not present

## 2022-09-15 DIAGNOSIS — H269 Unspecified cataract: Secondary | ICD-10-CM | POA: Diagnosis not present

## 2022-09-16 DIAGNOSIS — F02818 Dementia in other diseases classified elsewhere, unspecified severity, with other behavioral disturbance: Secondary | ICD-10-CM | POA: Diagnosis not present

## 2022-09-16 DIAGNOSIS — M25562 Pain in left knee: Secondary | ICD-10-CM | POA: Diagnosis not present

## 2022-09-16 DIAGNOSIS — Z9183 Wandering in diseases classified elsewhere: Secondary | ICD-10-CM | POA: Diagnosis not present

## 2022-09-16 DIAGNOSIS — B9620 Unspecified Escherichia coli [E. coli] as the cause of diseases classified elsewhere: Secondary | ICD-10-CM | POA: Diagnosis not present

## 2022-09-16 DIAGNOSIS — G319 Degenerative disease of nervous system, unspecified: Secondary | ICD-10-CM | POA: Diagnosis not present

## 2022-09-16 DIAGNOSIS — I119 Hypertensive heart disease without heart failure: Secondary | ICD-10-CM | POA: Diagnosis not present

## 2022-09-16 DIAGNOSIS — D860 Sarcoidosis of lung: Secondary | ICD-10-CM | POA: Diagnosis not present

## 2022-09-16 DIAGNOSIS — G9341 Metabolic encephalopathy: Secondary | ICD-10-CM | POA: Diagnosis not present

## 2022-09-16 DIAGNOSIS — N390 Urinary tract infection, site not specified: Secondary | ICD-10-CM | POA: Diagnosis not present

## 2022-09-20 DIAGNOSIS — R413 Other amnesia: Secondary | ICD-10-CM | POA: Diagnosis not present

## 2022-09-20 DIAGNOSIS — N390 Urinary tract infection, site not specified: Secondary | ICD-10-CM | POA: Diagnosis not present

## 2022-09-20 DIAGNOSIS — G319 Degenerative disease of nervous system, unspecified: Secondary | ICD-10-CM | POA: Diagnosis not present

## 2022-09-20 DIAGNOSIS — Z23 Encounter for immunization: Secondary | ICD-10-CM | POA: Diagnosis not present

## 2022-09-20 DIAGNOSIS — D860 Sarcoidosis of lung: Secondary | ICD-10-CM | POA: Diagnosis not present

## 2022-09-20 DIAGNOSIS — F02818 Dementia in other diseases classified elsewhere, unspecified severity, with other behavioral disturbance: Secondary | ICD-10-CM | POA: Diagnosis not present

## 2022-09-20 DIAGNOSIS — R5381 Other malaise: Secondary | ICD-10-CM | POA: Diagnosis not present

## 2022-09-20 DIAGNOSIS — J309 Allergic rhinitis, unspecified: Secondary | ICD-10-CM | POA: Diagnosis not present

## 2022-09-20 DIAGNOSIS — B9620 Unspecified Escherichia coli [E. coli] as the cause of diseases classified elsewhere: Secondary | ICD-10-CM | POA: Diagnosis not present

## 2022-09-20 DIAGNOSIS — G9341 Metabolic encephalopathy: Secondary | ICD-10-CM | POA: Diagnosis not present

## 2022-09-20 DIAGNOSIS — I10 Essential (primary) hypertension: Secondary | ICD-10-CM | POA: Diagnosis not present

## 2022-09-20 DIAGNOSIS — Z9183 Wandering in diseases classified elsewhere: Secondary | ICD-10-CM | POA: Diagnosis not present

## 2022-09-20 DIAGNOSIS — I119 Hypertensive heart disease without heart failure: Secondary | ICD-10-CM | POA: Diagnosis not present

## 2022-09-20 DIAGNOSIS — R059 Cough, unspecified: Secondary | ICD-10-CM | POA: Diagnosis not present

## 2022-09-20 DIAGNOSIS — D869 Sarcoidosis, unspecified: Secondary | ICD-10-CM | POA: Diagnosis not present

## 2022-09-20 DIAGNOSIS — M25562 Pain in left knee: Secondary | ICD-10-CM | POA: Diagnosis not present

## 2022-09-21 DIAGNOSIS — Z9183 Wandering in diseases classified elsewhere: Secondary | ICD-10-CM | POA: Diagnosis not present

## 2022-09-21 DIAGNOSIS — B9620 Unspecified Escherichia coli [E. coli] as the cause of diseases classified elsewhere: Secondary | ICD-10-CM | POA: Diagnosis not present

## 2022-09-21 DIAGNOSIS — N390 Urinary tract infection, site not specified: Secondary | ICD-10-CM | POA: Diagnosis not present

## 2022-09-21 DIAGNOSIS — D860 Sarcoidosis of lung: Secondary | ICD-10-CM | POA: Diagnosis not present

## 2022-09-21 DIAGNOSIS — F02818 Dementia in other diseases classified elsewhere, unspecified severity, with other behavioral disturbance: Secondary | ICD-10-CM | POA: Diagnosis not present

## 2022-09-21 DIAGNOSIS — G9341 Metabolic encephalopathy: Secondary | ICD-10-CM | POA: Diagnosis not present

## 2022-09-21 DIAGNOSIS — G319 Degenerative disease of nervous system, unspecified: Secondary | ICD-10-CM | POA: Diagnosis not present

## 2022-09-21 DIAGNOSIS — M25562 Pain in left knee: Secondary | ICD-10-CM | POA: Diagnosis not present

## 2022-09-21 DIAGNOSIS — I119 Hypertensive heart disease without heart failure: Secondary | ICD-10-CM | POA: Diagnosis not present

## 2022-09-23 DIAGNOSIS — N390 Urinary tract infection, site not specified: Secondary | ICD-10-CM | POA: Diagnosis not present

## 2022-09-23 DIAGNOSIS — B9620 Unspecified Escherichia coli [E. coli] as the cause of diseases classified elsewhere: Secondary | ICD-10-CM | POA: Diagnosis not present

## 2022-09-23 DIAGNOSIS — G319 Degenerative disease of nervous system, unspecified: Secondary | ICD-10-CM | POA: Diagnosis not present

## 2022-09-23 DIAGNOSIS — M25562 Pain in left knee: Secondary | ICD-10-CM | POA: Diagnosis not present

## 2022-09-23 DIAGNOSIS — D860 Sarcoidosis of lung: Secondary | ICD-10-CM | POA: Diagnosis not present

## 2022-09-23 DIAGNOSIS — I119 Hypertensive heart disease without heart failure: Secondary | ICD-10-CM | POA: Diagnosis not present

## 2022-09-23 DIAGNOSIS — Z9183 Wandering in diseases classified elsewhere: Secondary | ICD-10-CM | POA: Diagnosis not present

## 2022-09-23 DIAGNOSIS — F02818 Dementia in other diseases classified elsewhere, unspecified severity, with other behavioral disturbance: Secondary | ICD-10-CM | POA: Diagnosis not present

## 2022-09-23 DIAGNOSIS — G9341 Metabolic encephalopathy: Secondary | ICD-10-CM | POA: Diagnosis not present

## 2022-09-27 DIAGNOSIS — R413 Other amnesia: Secondary | ICD-10-CM | POA: Diagnosis not present

## 2022-09-28 DIAGNOSIS — B9620 Unspecified Escherichia coli [E. coli] as the cause of diseases classified elsewhere: Secondary | ICD-10-CM | POA: Diagnosis not present

## 2022-09-28 DIAGNOSIS — G9341 Metabolic encephalopathy: Secondary | ICD-10-CM | POA: Diagnosis not present

## 2022-09-28 DIAGNOSIS — M25562 Pain in left knee: Secondary | ICD-10-CM | POA: Diagnosis not present

## 2022-09-28 DIAGNOSIS — G319 Degenerative disease of nervous system, unspecified: Secondary | ICD-10-CM | POA: Diagnosis not present

## 2022-09-28 DIAGNOSIS — D860 Sarcoidosis of lung: Secondary | ICD-10-CM | POA: Diagnosis not present

## 2022-09-28 DIAGNOSIS — F02818 Dementia in other diseases classified elsewhere, unspecified severity, with other behavioral disturbance: Secondary | ICD-10-CM | POA: Diagnosis not present

## 2022-09-28 DIAGNOSIS — N390 Urinary tract infection, site not specified: Secondary | ICD-10-CM | POA: Diagnosis not present

## 2022-09-28 DIAGNOSIS — Z9183 Wandering in diseases classified elsewhere: Secondary | ICD-10-CM | POA: Diagnosis not present

## 2022-09-28 DIAGNOSIS — I119 Hypertensive heart disease without heart failure: Secondary | ICD-10-CM | POA: Diagnosis not present

## 2022-09-29 DIAGNOSIS — I119 Hypertensive heart disease without heart failure: Secondary | ICD-10-CM | POA: Diagnosis not present

## 2022-09-29 DIAGNOSIS — B9620 Unspecified Escherichia coli [E. coli] as the cause of diseases classified elsewhere: Secondary | ICD-10-CM | POA: Diagnosis not present

## 2022-09-29 DIAGNOSIS — G9341 Metabolic encephalopathy: Secondary | ICD-10-CM | POA: Diagnosis not present

## 2022-09-29 DIAGNOSIS — G319 Degenerative disease of nervous system, unspecified: Secondary | ICD-10-CM | POA: Diagnosis not present

## 2022-09-29 DIAGNOSIS — F02818 Dementia in other diseases classified elsewhere, unspecified severity, with other behavioral disturbance: Secondary | ICD-10-CM | POA: Diagnosis not present

## 2022-09-29 DIAGNOSIS — D860 Sarcoidosis of lung: Secondary | ICD-10-CM | POA: Diagnosis not present

## 2022-09-29 DIAGNOSIS — Z9183 Wandering in diseases classified elsewhere: Secondary | ICD-10-CM | POA: Diagnosis not present

## 2022-09-29 DIAGNOSIS — M25562 Pain in left knee: Secondary | ICD-10-CM | POA: Diagnosis not present

## 2022-09-29 DIAGNOSIS — N390 Urinary tract infection, site not specified: Secondary | ICD-10-CM | POA: Diagnosis not present

## 2022-09-30 DIAGNOSIS — G9341 Metabolic encephalopathy: Secondary | ICD-10-CM | POA: Diagnosis not present

## 2022-09-30 DIAGNOSIS — F02818 Dementia in other diseases classified elsewhere, unspecified severity, with other behavioral disturbance: Secondary | ICD-10-CM | POA: Diagnosis not present

## 2022-09-30 DIAGNOSIS — N390 Urinary tract infection, site not specified: Secondary | ICD-10-CM | POA: Diagnosis not present

## 2022-09-30 DIAGNOSIS — B9620 Unspecified Escherichia coli [E. coli] as the cause of diseases classified elsewhere: Secondary | ICD-10-CM | POA: Diagnosis not present

## 2022-09-30 DIAGNOSIS — I119 Hypertensive heart disease without heart failure: Secondary | ICD-10-CM | POA: Diagnosis not present

## 2022-09-30 DIAGNOSIS — M25562 Pain in left knee: Secondary | ICD-10-CM | POA: Diagnosis not present

## 2022-09-30 DIAGNOSIS — G319 Degenerative disease of nervous system, unspecified: Secondary | ICD-10-CM | POA: Diagnosis not present

## 2022-09-30 DIAGNOSIS — Z9183 Wandering in diseases classified elsewhere: Secondary | ICD-10-CM | POA: Diagnosis not present

## 2022-09-30 DIAGNOSIS — D860 Sarcoidosis of lung: Secondary | ICD-10-CM | POA: Diagnosis not present

## 2022-10-04 DIAGNOSIS — M25562 Pain in left knee: Secondary | ICD-10-CM | POA: Diagnosis not present

## 2022-10-04 DIAGNOSIS — G9341 Metabolic encephalopathy: Secondary | ICD-10-CM | POA: Diagnosis not present

## 2022-10-04 DIAGNOSIS — Z9183 Wandering in diseases classified elsewhere: Secondary | ICD-10-CM | POA: Diagnosis not present

## 2022-10-04 DIAGNOSIS — N390 Urinary tract infection, site not specified: Secondary | ICD-10-CM | POA: Diagnosis not present

## 2022-10-04 DIAGNOSIS — B9620 Unspecified Escherichia coli [E. coli] as the cause of diseases classified elsewhere: Secondary | ICD-10-CM | POA: Diagnosis not present

## 2022-10-04 DIAGNOSIS — G319 Degenerative disease of nervous system, unspecified: Secondary | ICD-10-CM | POA: Diagnosis not present

## 2022-10-04 DIAGNOSIS — F02818 Dementia in other diseases classified elsewhere, unspecified severity, with other behavioral disturbance: Secondary | ICD-10-CM | POA: Diagnosis not present

## 2022-10-04 DIAGNOSIS — D860 Sarcoidosis of lung: Secondary | ICD-10-CM | POA: Diagnosis not present

## 2022-10-04 DIAGNOSIS — I119 Hypertensive heart disease without heart failure: Secondary | ICD-10-CM | POA: Diagnosis not present

## 2022-10-06 DIAGNOSIS — D860 Sarcoidosis of lung: Secondary | ICD-10-CM | POA: Diagnosis not present

## 2022-10-06 DIAGNOSIS — N390 Urinary tract infection, site not specified: Secondary | ICD-10-CM | POA: Diagnosis not present

## 2022-10-06 DIAGNOSIS — M25562 Pain in left knee: Secondary | ICD-10-CM | POA: Diagnosis not present

## 2022-10-06 DIAGNOSIS — B9620 Unspecified Escherichia coli [E. coli] as the cause of diseases classified elsewhere: Secondary | ICD-10-CM | POA: Diagnosis not present

## 2022-10-06 DIAGNOSIS — I119 Hypertensive heart disease without heart failure: Secondary | ICD-10-CM | POA: Diagnosis not present

## 2022-10-06 DIAGNOSIS — Z9183 Wandering in diseases classified elsewhere: Secondary | ICD-10-CM | POA: Diagnosis not present

## 2022-10-06 DIAGNOSIS — G319 Degenerative disease of nervous system, unspecified: Secondary | ICD-10-CM | POA: Diagnosis not present

## 2022-10-06 DIAGNOSIS — F02818 Dementia in other diseases classified elsewhere, unspecified severity, with other behavioral disturbance: Secondary | ICD-10-CM | POA: Diagnosis not present

## 2022-10-06 DIAGNOSIS — G9341 Metabolic encephalopathy: Secondary | ICD-10-CM | POA: Diagnosis not present

## 2022-10-07 DIAGNOSIS — D860 Sarcoidosis of lung: Secondary | ICD-10-CM | POA: Diagnosis not present

## 2022-10-07 DIAGNOSIS — G9341 Metabolic encephalopathy: Secondary | ICD-10-CM | POA: Diagnosis not present

## 2022-10-07 DIAGNOSIS — F02818 Dementia in other diseases classified elsewhere, unspecified severity, with other behavioral disturbance: Secondary | ICD-10-CM | POA: Diagnosis not present

## 2022-10-07 DIAGNOSIS — I119 Hypertensive heart disease without heart failure: Secondary | ICD-10-CM | POA: Diagnosis not present

## 2022-10-07 DIAGNOSIS — Z9183 Wandering in diseases classified elsewhere: Secondary | ICD-10-CM | POA: Diagnosis not present

## 2022-10-07 DIAGNOSIS — M25562 Pain in left knee: Secondary | ICD-10-CM | POA: Diagnosis not present

## 2022-10-07 DIAGNOSIS — G319 Degenerative disease of nervous system, unspecified: Secondary | ICD-10-CM | POA: Diagnosis not present

## 2022-10-07 DIAGNOSIS — B9620 Unspecified Escherichia coli [E. coli] as the cause of diseases classified elsewhere: Secondary | ICD-10-CM | POA: Diagnosis not present

## 2022-10-07 DIAGNOSIS — N390 Urinary tract infection, site not specified: Secondary | ICD-10-CM | POA: Diagnosis not present

## 2022-10-11 DIAGNOSIS — G9341 Metabolic encephalopathy: Secondary | ICD-10-CM | POA: Diagnosis not present

## 2022-10-11 DIAGNOSIS — I119 Hypertensive heart disease without heart failure: Secondary | ICD-10-CM | POA: Diagnosis not present

## 2022-10-11 DIAGNOSIS — M25562 Pain in left knee: Secondary | ICD-10-CM | POA: Diagnosis not present

## 2022-10-11 DIAGNOSIS — N390 Urinary tract infection, site not specified: Secondary | ICD-10-CM | POA: Diagnosis not present

## 2022-10-11 DIAGNOSIS — F02818 Dementia in other diseases classified elsewhere, unspecified severity, with other behavioral disturbance: Secondary | ICD-10-CM | POA: Diagnosis not present

## 2022-10-11 DIAGNOSIS — D860 Sarcoidosis of lung: Secondary | ICD-10-CM | POA: Diagnosis not present

## 2022-10-11 DIAGNOSIS — Z9183 Wandering in diseases classified elsewhere: Secondary | ICD-10-CM | POA: Diagnosis not present

## 2022-10-11 DIAGNOSIS — G319 Degenerative disease of nervous system, unspecified: Secondary | ICD-10-CM | POA: Diagnosis not present

## 2022-10-11 DIAGNOSIS — B9620 Unspecified Escherichia coli [E. coli] as the cause of diseases classified elsewhere: Secondary | ICD-10-CM | POA: Diagnosis not present

## 2022-10-13 ENCOUNTER — Encounter: Payer: Self-pay | Admitting: Neurology

## 2022-10-13 ENCOUNTER — Ambulatory Visit: Payer: Medicare PPO | Admitting: Neurology

## 2022-10-13 VITALS — BP 169/93 | HR 112 | Ht 64.0 in | Wt 118.0 lb

## 2022-10-13 DIAGNOSIS — G9341 Metabolic encephalopathy: Secondary | ICD-10-CM | POA: Diagnosis not present

## 2022-10-13 DIAGNOSIS — Z9183 Wandering in diseases classified elsewhere: Secondary | ICD-10-CM | POA: Diagnosis not present

## 2022-10-13 DIAGNOSIS — F02818 Dementia in other diseases classified elsewhere, unspecified severity, with other behavioral disturbance: Secondary | ICD-10-CM | POA: Diagnosis not present

## 2022-10-13 DIAGNOSIS — F02A3 Dementia in other diseases classified elsewhere, mild, with mood disturbance: Secondary | ICD-10-CM | POA: Diagnosis not present

## 2022-10-13 DIAGNOSIS — G319 Degenerative disease of nervous system, unspecified: Secondary | ICD-10-CM | POA: Diagnosis not present

## 2022-10-13 DIAGNOSIS — I119 Hypertensive heart disease without heart failure: Secondary | ICD-10-CM | POA: Diagnosis not present

## 2022-10-13 DIAGNOSIS — D860 Sarcoidosis of lung: Secondary | ICD-10-CM | POA: Diagnosis not present

## 2022-10-13 DIAGNOSIS — B9620 Unspecified Escherichia coli [E. coli] as the cause of diseases classified elsewhere: Secondary | ICD-10-CM | POA: Diagnosis not present

## 2022-10-13 DIAGNOSIS — N390 Urinary tract infection, site not specified: Secondary | ICD-10-CM | POA: Diagnosis not present

## 2022-10-13 DIAGNOSIS — M25562 Pain in left knee: Secondary | ICD-10-CM | POA: Diagnosis not present

## 2022-10-13 DIAGNOSIS — G301 Alzheimer's disease with late onset: Secondary | ICD-10-CM | POA: Diagnosis not present

## 2022-10-13 NOTE — Progress Notes (Signed)
[facility]  PATIENT: [name] Combs DOB: Jun 10, 1941  REQUESTING CLINICIAN: Olevia Perches, NP HISTORY FROM: Patient, son and daughter via phone  REASON FOR VISIT: Worsening memory    HISTORICAL  CHIEF COMPLAINT:  Chief Complaint  Patient presents with   New Patient (Initial Visit)    Rm 12, with son Memory concerns     HISTORY OF PRESENT ILLNESS:  This is a 81 year old woman past medical history of hypertension, cataract and pulmonary sarcoidosis who is presenting with memory changes.  Patient is accompanied by her son and also her daughter was on the phone.  Per family patient has been having memory issues for the past year.  Daughter described it as being forgetful, repetitive, asking the same questions over and over.  Daughter reports that at times during a phone call, patient can tell the same story multiple times.  At that time she was still independent, living alone, but daughter felt like she was not taking her medication correctly and daughter has also noted at time  she will also forget to cook or eat.  Due to that she has been giving her microwavable food and has been providing food for her.   On September 28 family has realized that patient has left the house, initially she states she was going to the post office but came back 12 hours later and it seemed like patient drove all the way to [place] before coming home when asked patient said that she went to [place] for a conference.  Son has found her disheveled, only wearing her underwear, son noted that she had urine incontinence and took her to the hospital where she was diagnosed with UTI.  Her acute confusional state was thought to be secondary to UTI and patient was given antibiotics.  Since discharge son has been living with patient, he has noted personality change that she is combative and angry about her son helping her with her medication.  They have not let her drive but patient still thinks  that she is driving.  She does repeat herself and asks the same questions over and over.  In terms of the infection, it is improving but feel they feel like her memory has gotten worse and has not recover.    TBI:   No past history of TBI Stroke:   no past history of stroke Seizures:   no past history of seizures Sleep:   no history of sleep apnea.   Mood: Slightly agitated per son.  Family history of Dementia:  Denies  Functional status: independent in some ADLs and IADLs Patient lives alone but son has been staying with her in the past month. Cooking: no Cleaning: no Shopping: no Bathing: needs help Toileting: needs help Driving: no Bills: Daughter is taking over after paid her Chief Strategy Officer twice  Medications: Son is helping with her meds  Ever left the stove on by accident?: denies Forget how to use items around the house?: denies Getting lost going to familiar places?: yes Forgetting loved ones names?: denies Word finding difficulty? denies Sleep: no issues   OTHER MEDICAL CONDITIONS: Hypertension, pulmonary sarcoidosis    REVIEW OF SYSTEMS: Full 14 system review of systems performed and negative with exception of: As noted in the HPI   ALLERGIES: Allergies  Allergen Reactions   Codeine Cough   Sesame Seed (Diagnostic) Hives    HOME MEDICATIONS: Outpatient Medications Prior to Visit  Medication Sig Dispense Refill   amLODipine (NORVASC) 10  MG tablet Take 1 tablet (10 mg total) by mouth at bedtime. 30 tablet 2   Multiple Vitamin (MULTIVITAMIN) capsule Take 1 capsule by mouth daily.     cefdinir (OMNICEF) 300 MG capsule Take 1 capsule (300 mg total) by mouth every 12 (twelve) hours. 4 capsule 0   No facility-administered medications prior to visit.    PAST MEDICAL HISTORY: Past Medical History:  Diagnosis Date   Cataract    Hypertension    Sarcoidosis     PAST SURGICAL HISTORY: History reviewed. No pertinent surgical history.  FAMILY  HISTORY: History reviewed. No pertinent family history.  SOCIAL HISTORY: Social History   Socioeconomic History   Marital status: Widowed    Spouse name: Not on file   Number of children: Not on file   Years of education: Not on file   Highest education level: Not on file  Occupational History   Not on file  Tobacco Use   Smoking status: Never   Smokeless tobacco: Never  Vaping Use   Vaping Use: Never used  Substance and Sexual Activity   Alcohol use: Never   Drug use: Never   Sexual activity: Not on file  Other Topics Concern   Not on file  Social History Narrative   Not on file   Social Determinants of Health   Financial Resource Strain: Not on file  Food Insecurity: Not on file  Transportation Needs: Not on file  Physical Activity: Not on file  Stress: Not on file  Social Connections: Not on file  Intimate Partner Violence: Not on file    PHYSICAL EXAM  GENERAL EXAM/CONSTITUTIONAL: Vitals:  Vitals:   10/13/22 0827  BP: (!) 169/93  Pulse: (!) 112  Weight: 118 lb (53.5 kg)  Height: 5\' 4"  (1.626 m)   Body mass index is 20.25 kg/m. Wt Readings from Last 3 Encounters:  10/13/22 118 lb (53.5 kg)  09/10/22 119 lb 0.8 oz (54 kg)   Patient is in no distress; well developed, nourished and groomed; neck is supple   EYES: Pupils round and reactive to light, Visual fields full to confrontation, Extraocular movements intacts,   MUSCULOSKELETAL: Gait, strength, tone, movements noted in Neurologic exam below  NEUROLOGIC: MENTAL STATUS:      No data to display            10/13/2022    8:32 AM  Montreal Cognitive Assessment   Visuospatial/ Executive (0/5) 3  Naming (0/3) 3  Attention: Read list of digits (0/2) 1  Attention: Read list of letters (0/1) 1  Attention: Serial 7 subtraction starting at 100 (0/3) 3  Language: Repeat phrase (0/2) 1  Language : Fluency (0/1) 1  Abstraction (0/2) 2  Delayed Recall (0/5) 0  Orientation (0/6) 3  Total 18      CRANIAL NERVE:  2nd, 3rd, 4th, 6th - pupils equal and reactive to light, visual fields full to confrontation, extraocular muscles intact, no nystagmus 5th - facial sensation symmetric 7th - facial strength symmetric 8th - hearing intact 9th - palate elevates symmetrically, uvula midline 11th - shoulder shrug symmetric 12th - tongue protrusion midline  MOTOR:  normal bulk and tone, full strength in the BUE, BLE  COORDINATION:  finger-nose-finger, fine finger movements normal   GAIT/STATION:  normal    DIAGNOSTIC DATA (LABS, IMAGING, TESTING) - I reviewed patient records, labs, notes, testing and imaging myself where available.  Lab Results  Component Value Date   WBC 5.4 09/11/2022   HGB 13.2 09/11/2022  HCT 40.5 09/11/2022   MCV 91.8 09/11/2022   PLT 311 09/11/2022      Component Value Date/Time   NA 140 09/11/2022 0723   K 3.9 09/11/2022 0723   CL 103 09/11/2022 0723   CO2 28 09/11/2022 0723   GLUCOSE 94 09/11/2022 0723   BUN 10 09/11/2022 0723   CREATININE 0.65 09/11/2022 0723   CALCIUM 9.5 09/11/2022 0723   PROT 7.7 09/09/2022 1530   ALBUMIN 3.8 09/09/2022 1530   AST 17 09/09/2022 1530   ALT 6 09/09/2022 1530   ALKPHOS 80 09/09/2022 1530   BILITOT 0.9 09/09/2022 1530   GFRNONAA >60 09/11/2022 0723   No results found for: "CHOL", "HDL", "LDLCALC", "LDLDIRECT", "TRIG", "CHOLHDL" No results found for: "HGBA1C" No results found for: "VITAMINB12" Lab Results  Component Value Date   TSH 2.348 09/09/2022    CT Head 09/09/22 Atrophy with small vessel chronic ischemic changes of deep cerebral white matter. No acute intracranial abnormalities   ASSESSMENT AND PLAN  81 y.o. year old female with history of hypertension, pulmonary sarcoidosis who is presenting with memory complaint for the past year and getting worse since being diagnosed with UTI on September 28.  Her main problems are described as being forgetful, being repetitive, and asking the same  questions.  There is also confusion.  On exam today she scored 18 out of 30 on the MoCA indicative of moderate impairment.  Based on history and examination, patient likely has dementia Alzheimer type.  We discussed medications including Aricept and Namenda but family would like to hold at the moment.  In terms of the mood swing and mild agitation, we also discussed medication including antidepressant first and later antipsychotic and we also discussed the black box warning with antipsychotic.  Again family wants to hold all medications for now. They are also looking into taking patient to an assisted living or a nursing home with the memory care.  I do agree that patient is not safe to be left alone.  I did advise them to first look into getting care at home since patient always wanted to stay home, but if not possible then consider a facility.  They voiced understanding.  Advised them to contact me if there is any worsening of the dementia, or worsening of the behavior, at that time we will discuss again about medication.  Follow-up in 52-months or sooner if worse.   1. 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 Facility. I do agree that she should not be alone  Follow up in 6 months     There are well-accepted and sensible ways to reduce risk for Alzheimers disease and other degenerative brain disorders .  Exercise Daily Walk A daily 20 minute walk should be part of your routine. Disease related apathy can be a significant roadblock to exercise and the only way to overcome this is to make it a daily routine and perhaps have a reward at the end (something your loved one loves to eat or drink perhaps) or a personal trainer coming to the home can also be very useful. Most importantly, the patient is  much more likely to exercise if the caregiver / spouse does it with him/her. In general a structured, repetitive schedule is best.  General Health: Any diseases which effect your  body will effect your brain such as a pneumonia, urinary infection, blood clot, heart attack or stroke. Keep contact with your primary care doctor for regular follow ups.  Sleep. A good nights sleep is healthy for the brain. Seven hours is recommended. If you have insomnia or poor sleep habits we can give you some instructions. If you have sleep apnea wear your mask.  Diet: Eating a heart healthy diet is also a good idea; fish and poultry instead of red meat, nuts (mostly non-peanuts), vegetables, fruits, olive oil or canola oil (instead of butter), minimal salt (use other spices to flavor foods), whole grain rice, bread, cereal and pasta and wine in moderation.Research is now showing that the MIND diet, which is a combination of The Mediterranean diet and the DASH diet, is beneficial for cognitive processing and longevity. Information about this diet can be found in The MIND Diet, a book by Doyne Keel, MS, RDN, and online at [employer]bookDist[employer].si  Finances, Power of Attorney and Advance Directives: You should consider putting legal safeguards in place with regard to financial and medical decision making. While the spouse always has power of attorney for medical and financial issues in the absence of any form, you should consider what you want in case the spouse / caregiver is no longer around or capable of making decisions.   No orders of the defined types were placed in this encounter.   No orders of the defined types were placed in this encounter.   Return in about 6 months (around 04/13/2023).  I have spent a total of 90 minutes dedicated to this patient today, preparing to see patient, performing a medically appropriate examination and evaluation, ordering tests and/or medications and  procedures, and counseling and educating the patient/family/caregiver; independently interpreting result and communicating results to the family/patient/caregiver; and documenting clinical information in the electronic medical record.   Alric Ran, MD 10/13/2022, 1:24 PM  [facility] [address] [place],  [address] [phone]

## 2022-10-13 NOTE — Patient Instructions (Signed)
Continue current medications  Follow up with PCP  Continue discussion with family member and Education officer, museum regarding best placement for patient, home vs. Facility. I do agree that she should not be alone  Follow up in 6 months     There are well-accepted and sensible ways to reduce risk for Alzheimers disease and other degenerative brain disorders .  Exercise Daily Walk A daily 20 minute walk should be part of your routine. Disease related apathy can be a significant roadblock to exercise and the only way to overcome this is to make it a daily routine and perhaps have a reward at the end (something your loved one loves to eat or drink perhaps) or a personal trainer coming to the home can also be very useful. Most importantly, the patient is much more likely to exercise if the caregiver / spouse does it with him/her. In general a structured, repetitive schedule is best.  General Health: Any diseases which effect your body will effect your brain such as a pneumonia, urinary infection, blood clot, heart attack or stroke. Keep contact with your primary care doctor for regular follow ups.  Sleep. A good nights sleep is healthy for the brain. Seven hours is recommended. If you have insomnia or poor sleep habits we can give you some instructions. If you have sleep apnea wear your mask.  Diet: Eating a heart healthy diet is also a good idea; fish and poultry instead of red meat, nuts (mostly non-peanuts), vegetables, fruits, olive oil or canola oil (instead of butter), minimal salt (use other spices to flavor foods), whole grain rice, bread, cereal and pasta and wine in moderation.Research is now showing that the MIND diet, which is a combination of The Mediterranean diet and the DASH diet, is beneficial for cognitive processing and longevity. Information about this diet can be found in The MIND Diet, a book by Doyne Keel, MS, RDN, and online at [employer]book[employer].si  Finances,  Power of Attorney and Advance Directives: You should consider putting legal safeguards in place with regard to financial and medical decision making. While the spouse always has power of attorney for medical and financial issues in the absence of any form, you should consider what you want in case the spouse / caregiver is no longer around or capable of making decisions.

## 2022-10-14 ENCOUNTER — Telehealth: Payer: Self-pay | Admitting: Neurology

## 2022-10-14 DIAGNOSIS — N390 Urinary tract infection, site not specified: Secondary | ICD-10-CM | POA: Diagnosis not present

## 2022-10-14 DIAGNOSIS — D860 Sarcoidosis of lung: Secondary | ICD-10-CM | POA: Diagnosis not present

## 2022-10-14 DIAGNOSIS — Z9183 Wandering in diseases classified elsewhere: Secondary | ICD-10-CM | POA: Diagnosis not present

## 2022-10-14 DIAGNOSIS — M25562 Pain in left knee: Secondary | ICD-10-CM | POA: Diagnosis not present

## 2022-10-14 DIAGNOSIS — F02818 Dementia in other diseases classified elsewhere, unspecified severity, with other behavioral disturbance: Secondary | ICD-10-CM | POA: Diagnosis not present

## 2022-10-14 DIAGNOSIS — I119 Hypertensive heart disease without heart failure: Secondary | ICD-10-CM | POA: Diagnosis not present

## 2022-10-14 DIAGNOSIS — G319 Degenerative disease of nervous system, unspecified: Secondary | ICD-10-CM | POA: Diagnosis not present

## 2022-10-14 DIAGNOSIS — B9620 Unspecified Escherichia coli [E. coli] as the cause of diseases classified elsewhere: Secondary | ICD-10-CM | POA: Diagnosis not present

## 2022-10-14 DIAGNOSIS — G9341 Metabolic encephalopathy: Secondary | ICD-10-CM | POA: Diagnosis not present

## 2022-10-14 NOTE — Telephone Encounter (Signed)
My chart message regarding insurance

## 2022-10-19 DIAGNOSIS — F02818 Dementia in other diseases classified elsewhere, unspecified severity, with other behavioral disturbance: Secondary | ICD-10-CM | POA: Diagnosis not present

## 2022-10-19 DIAGNOSIS — Z9183 Wandering in diseases classified elsewhere: Secondary | ICD-10-CM | POA: Diagnosis not present

## 2022-10-19 DIAGNOSIS — B9620 Unspecified Escherichia coli [E. coli] as the cause of diseases classified elsewhere: Secondary | ICD-10-CM | POA: Diagnosis not present

## 2022-10-19 DIAGNOSIS — D860 Sarcoidosis of lung: Secondary | ICD-10-CM | POA: Diagnosis not present

## 2022-10-19 DIAGNOSIS — M25562 Pain in left knee: Secondary | ICD-10-CM | POA: Diagnosis not present

## 2022-10-19 DIAGNOSIS — N390 Urinary tract infection, site not specified: Secondary | ICD-10-CM | POA: Diagnosis not present

## 2022-10-19 DIAGNOSIS — I119 Hypertensive heart disease without heart failure: Secondary | ICD-10-CM | POA: Diagnosis not present

## 2022-10-19 DIAGNOSIS — G9341 Metabolic encephalopathy: Secondary | ICD-10-CM | POA: Diagnosis not present

## 2022-10-19 DIAGNOSIS — G319 Degenerative disease of nervous system, unspecified: Secondary | ICD-10-CM | POA: Diagnosis not present

## 2022-10-20 DIAGNOSIS — G9341 Metabolic encephalopathy: Secondary | ICD-10-CM | POA: Diagnosis not present

## 2022-10-20 DIAGNOSIS — B9620 Unspecified Escherichia coli [E. coli] as the cause of diseases classified elsewhere: Secondary | ICD-10-CM | POA: Diagnosis not present

## 2022-10-20 DIAGNOSIS — N390 Urinary tract infection, site not specified: Secondary | ICD-10-CM | POA: Diagnosis not present

## 2022-10-20 DIAGNOSIS — Z9183 Wandering in diseases classified elsewhere: Secondary | ICD-10-CM | POA: Diagnosis not present

## 2022-10-20 DIAGNOSIS — D860 Sarcoidosis of lung: Secondary | ICD-10-CM | POA: Diagnosis not present

## 2022-10-20 DIAGNOSIS — G319 Degenerative disease of nervous system, unspecified: Secondary | ICD-10-CM | POA: Diagnosis not present

## 2022-10-20 DIAGNOSIS — M25562 Pain in left knee: Secondary | ICD-10-CM | POA: Diagnosis not present

## 2022-10-20 DIAGNOSIS — I119 Hypertensive heart disease without heart failure: Secondary | ICD-10-CM | POA: Diagnosis not present

## 2022-10-20 DIAGNOSIS — F02818 Dementia in other diseases classified elsewhere, unspecified severity, with other behavioral disturbance: Secondary | ICD-10-CM | POA: Diagnosis not present

## 2022-10-28 DIAGNOSIS — B9620 Unspecified Escherichia coli [E. coli] as the cause of diseases classified elsewhere: Secondary | ICD-10-CM | POA: Diagnosis not present

## 2022-10-28 DIAGNOSIS — M25562 Pain in left knee: Secondary | ICD-10-CM | POA: Diagnosis not present

## 2022-10-28 DIAGNOSIS — D860 Sarcoidosis of lung: Secondary | ICD-10-CM | POA: Diagnosis not present

## 2022-10-28 DIAGNOSIS — I119 Hypertensive heart disease without heart failure: Secondary | ICD-10-CM | POA: Diagnosis not present

## 2022-10-28 DIAGNOSIS — Z9183 Wandering in diseases classified elsewhere: Secondary | ICD-10-CM | POA: Diagnosis not present

## 2022-10-28 DIAGNOSIS — F02818 Dementia in other diseases classified elsewhere, unspecified severity, with other behavioral disturbance: Secondary | ICD-10-CM | POA: Diagnosis not present

## 2022-10-28 DIAGNOSIS — G9341 Metabolic encephalopathy: Secondary | ICD-10-CM | POA: Diagnosis not present

## 2022-10-28 DIAGNOSIS — N390 Urinary tract infection, site not specified: Secondary | ICD-10-CM | POA: Diagnosis not present

## 2022-10-28 DIAGNOSIS — G319 Degenerative disease of nervous system, unspecified: Secondary | ICD-10-CM | POA: Diagnosis not present

## 2022-10-30 ENCOUNTER — Encounter (HOSPITAL_COMMUNITY): Payer: Self-pay | Admitting: *Deleted

## 2022-10-30 ENCOUNTER — Ambulatory Visit (HOSPITAL_COMMUNITY)
Admission: EM | Admit: 2022-10-30 | Discharge: 2022-10-30 | Disposition: A | Discharge status: Home / Self Care | Payer: Medicare PPO | Attending: Physician Assistant | Admitting: Physician Assistant

## 2022-10-30 ENCOUNTER — Other Ambulatory Visit: Payer: Self-pay

## 2022-10-30 ENCOUNTER — Ambulatory Visit (INDEPENDENT_AMBULATORY_CARE_PROVIDER_SITE_OTHER): Payer: Medicare PPO

## 2022-10-30 DIAGNOSIS — Z20822 Contact with and (suspected) exposure to covid-19: Secondary | ICD-10-CM | POA: Insufficient documentation

## 2022-10-30 DIAGNOSIS — J029 Acute pharyngitis, unspecified: Secondary | ICD-10-CM | POA: Diagnosis not present

## 2022-10-30 DIAGNOSIS — R059 Cough, unspecified: Secondary | ICD-10-CM | POA: Diagnosis not present

## 2022-10-30 DIAGNOSIS — J069 Acute upper respiratory infection, unspecified: Secondary | ICD-10-CM | POA: Diagnosis not present

## 2022-10-30 DIAGNOSIS — R051 Acute cough: Secondary | ICD-10-CM

## 2022-10-30 LAB — RESP PANEL BY RT-PCR (RSV, FLU A&B, COVID)  RVPGX2
Influenza A by PCR: NEGATIVE
Influenza B by PCR: NEGATIVE
Resp Syncytial Virus by PCR: POSITIVE — AB
SARS Coronavirus 2 by RT PCR: NEGATIVE

## 2022-10-30 LAB — POCT RAPID STREP A, ED / UC: Streptococcus, Group A Screen (Direct): NEGATIVE

## 2022-10-30 MED ORDER — BENZONATATE 100 MG PO CAPS: 100.0000 mg | ORAL_CAPSULE | Freq: Three times a day (TID) | ORAL | 0 refills | Status: AC

## 2022-10-30 MED ORDER — FLUTICASONE PROPIONATE 50 MCG/ACT NA SUSP: 1.0000 | Freq: Every day | NASAL | 0 refills | Status: AC

## 2022-10-30 MED ORDER — ALBUTEROL SULFATE HFA 108 (90 BASE) MCG/ACT IN AERS: 1.0000 | INHALATION_SPRAY | Freq: Four times a day (QID) | RESPIRATORY_TRACT | 0 refills | Status: AC | PRN

## 2022-10-30 NOTE — Discharge Instructions (Signed)
Her chest x-ray was normal with no evidence of pneumonia.  Her strep was negative.  I will contact you with her COVID/flu/RSV testing as soon as I have these results if they are positive.  Monitor MyChart for these results.  Use Tessalon for cough.  Use Flonase for nasal congestion.  Make sure she is resting and drinking plenty of fluid.  She should follow-up quickly with her primary care.  If anything worsens and she has worsening cough, shortness of breath, fever, nausea/vomiting interfere with oral intake, weakness, increased confusion she needs to go to the emergency room immediately.

## 2022-10-30 NOTE — ED Triage Notes (Signed)
Pt reports cough,sore throat and runny nose since WED.

## 2022-10-30 NOTE — ED Provider Notes (Signed)
[facility]    CSN: [id number] Arrival date & time: 10/30/22  1125      History   Chief Complaint Chief Complaint  Patient presents with   Cough    Have sore throat, cough and runny nose since Wednesday that medicine does not seem to help. - Entered by patient   Sore Throat   Nasal Congestion    HPI [name] Combs is a 81 y.o. female.   Patient presents today companied by her daughter who help provide the majority of history.  Reports a 4-day history of URI symptoms including sore throat, congestion, cough, decreased appetite, fatigue, malaise.  Denies any fever, chest pain, shortness of breath, nausea, vomiting, diarrhea.  Does report that there has been a virus explained to the family.  She has had COVID vaccines but has not had recent boosters.  She has not had COVID in the past.  Denies history of allergies, asthma, COPD.  She does have a history of pulmonary sarcoidosis and has a chronic cough but current symptoms are more extreme than previous episodes of this condition.  Denies any recent antibiotics or steroids.    Past Medical History:  Diagnosis Date   Cataract    Hypertension    Sarcoidosis     Patient Active Problem List   Diagnosis Date Noted   Altered mental status 09/11/2022   E. coli UTI 09/10/2022   Dementia with behavioral disturbance (HCC) 09/10/2022   Essential hypertension 09/10/2022   Pulmonary sarcoidosis (HCC) 09/10/2022   Acute metabolic encephalopathy 09/09/2022    History reviewed. No pertinent surgical history.  OB History   No obstetric history on file.      Home Medications    Prior to Admission medications   Medication Sig Start Date End Date Taking? Authorizing Provider  albuterol (VENTOLIN HFA) 108 (90 Base) MCG/ACT inhaler Inhale 1-2 puffs into the lungs every 6 (six) hours as needed for wheezing or shortness of breath. 10/30/22  Yes Daryn Pisani K, PA-C  benzonatate (TESSALON) 100 MG capsule Take 1 capsule (100 mg  total) by mouth every 8 (eight) hours. 10/30/22  Yes Clanton Emanuelson K, PA-C  fluticasone (FLONASE) 50 MCG/ACT nasal spray Place 1 spray into both nostrils daily. 10/30/22  Yes Dominic Rhome K, PA-C  amLODipine (NORVASC) 10 MG tablet Take 1 tablet (10 mg total) by mouth at bedtime. 09/12/22   Ghimire, Werner Lean, MD  Multiple Vitamin (MULTIVITAMIN) capsule Take 1 capsule by mouth daily.    [provider]    Family History History reviewed. No pertinent family history.  Social History Social History   Tobacco Use   Smoking status: Never   Smokeless tobacco: Never  Vaping Use   Vaping Use: Never used  Substance Use Topics   Alcohol use: Never   Drug use: Never     Allergies   Codeine and Sesame seed (diagnostic)   Review of Systems Review of Systems  Constitutional:  Positive for activity change, appetite change and fatigue. Negative for fever.  HENT:  Positive for congestion and sore throat. Negative for sinus pressure and sneezing.   Respiratory:  Positive for cough. Negative for shortness of breath.   Cardiovascular:  Negative for chest pain.  Gastrointestinal:  Negative for abdominal pain, diarrhea, nausea and vomiting.  Neurological:  Negative for dizziness, light-headedness and headaches.     Physical Exam Triage Vital Signs ED Triage Vitals  Enc Vitals Group     BP 10/30/22 1207 (!) 153/84  Pulse Rate 10/30/22 1207 98     Resp 10/30/22 1207 20     Temp 10/30/22 1207 98.3 F (36.8 C)     Temp src --      SpO2 10/30/22 1207 90 %     Weight --      Height --      Head Circumference --      Peak Flow --      Pain Score 10/30/22 1205 0     Pain Loc --      Pain Edu? --      Excl. in GC? --    No data found.  Updated Vital Signs BP (!) 153/84   Pulse 98   Temp 98.3 F (36.8 C)   Resp 20   SpO2 93%   Visual Acuity Right Eye Distance:   Left Eye Distance:   Bilateral Distance:    Right Eye Near:   Left Eye Near:    Bilateral Near:      Physical Exam Vitals reviewed.  Constitutional:      General: She is awake. She is not in acute distress.    Appearance: Normal appearance. She is well-developed. She is not ill-appearing.     Comments: Very pleasant female appears stated age in no acute distress sitting comfortably in exam room  HENT:     Head: Normocephalic and atraumatic.     Right Ear: Tympanic membrane, ear canal and external ear normal. Tympanic membrane is not erythematous or bulging.     Left Ear: Tympanic membrane, ear canal and external ear normal. Tympanic membrane is not erythematous or bulging.     Nose:     Right Sinus: No maxillary sinus tenderness or frontal sinus tenderness.     Left Sinus: No maxillary sinus tenderness or frontal sinus tenderness.     Mouth/Throat:     Pharynx: Uvula midline. Posterior oropharyngeal erythema present. No oropharyngeal exudate.  Cardiovascular:     Rate and Rhythm: Normal rate and regular rhythm.     Heart sounds: Normal heart sounds, S1 normal and S2 normal. No murmur heard. Pulmonary:     Effort: Pulmonary effort is normal.     Breath sounds: Examination of the right-lower field reveals decreased breath sounds. Examination of the left-lower field reveals decreased breath sounds. Decreased breath sounds present. No wheezing, rhonchi or rales.     Comments: Decreased aeration bilateral bases; no adventitious sounds. Psychiatric:        Behavior: Behavior is cooperative.      UC Treatments / Results  Labs (all labs ordered are listed, but only abnormal results are displayed) Labs Reviewed  RESP PANEL BY RT-PCR (RSV, FLU A&B, COVID)  [id number]  POCT RAPID STREP A, ED / UC    EKG   Radiology DG Chest 2 View  Result Date: 10/30/2022 CLINICAL DATA:  Worsening cough.  Sore throat. EXAM: CHEST - 2 VIEW COMPARISON:  September 09, 2022 FINDINGS: The heart size and mediastinal contours are within normal limits. Both lungs are clear. The visualized skeletal structures  are unremarkable. IMPRESSION: No active cardiopulmonary disease. Electronically Signed   By: Gerome Sam III M.D.   On: 10/30/2022 13:00    Procedures Procedures (including critical care time)  Medications Ordered in UC Medications - No data to display  Initial Impression / Assessment and Plan / UC Course  I have reviewed the triage vital signs and the nursing notes.  Pertinent labs & imaging results that were available during my  care of the patient were reviewed by me and considered in my medical decision making (see chart for details).     Patient is well-appearing, afebrile, nontoxic, nontachycardic.  Her oxygen saturation was slightly low on intake but this improved during her visit.  X-ray was obtained that showed no acute cardiopulmonary disease.  Strep testing was obtained per family request and was negative.  COVID/flu/RSV is pending.  Will defer antibiotics until we have viral testing results.  She was given Tessalon for cough and Flonase for congestion.  Recommend that she use over-the-counter medications for additional symptom relief.  Albuterol inhaler was sent for recurrent cough and shortness of breath symptoms but discussed that she should not use this frequently and if she requires using it more than a few times per day she must be reevaluated.  Recommend close follow-up with her primary care next week; they are to call to schedule an appointment first thing Monday.  If she has any worsening symptoms that she needs to be seen immediately including high fever, chest pain, shortness of breath, worsening cough, nausea/vomiting interfere with oral intake.  Strict return precautions given to which daughter expressed understanding.  Final Clinical Impressions(s) / UC Diagnoses   Final diagnoses:  Upper respiratory tract infection, unspecified type  Acute cough  Pharyngitis, unspecified etiology     Discharge Instructions      Her chest x-ray was normal with no evidence of  pneumonia.  Her strep was negative.  I will contact you with her COVID/flu/RSV testing as soon as I have these results if they are positive.  Monitor MyChart for these results.  Use Tessalon for cough.  Use Flonase for nasal congestion.  Make sure she is resting and drinking plenty of fluid.  She should follow-up quickly with her primary care.  If anything worsens and she has worsening cough, shortness of breath, fever, nausea/vomiting interfere with oral intake, weakness, increased confusion she needs to go to the emergency room immediately.     ED Prescriptions     Medication Sig Dispense Auth. Provider   benzonatate (TESSALON) 100 MG capsule Take 1 capsule (100 mg total) by mouth every 8 (eight) hours. 21 capsule Suzannah Bettes K, PA-C   fluticasone (FLONASE) 50 MCG/ACT nasal spray Place 1 spray into both nostrils daily. 16 g Daliana Leverett K, PA-C   albuterol (VENTOLIN HFA) 108 (90 Base) MCG/ACT inhaler Inhale 1-2 puffs into the lungs every 6 (six) hours as needed for wheezing or shortness of breath. 6.7 g Consuela Widener K, PA-C      PDMP not reviewed this encounter.   Jeani Hawking, PA-C 10/30/22 1328

## 2022-11-02 DIAGNOSIS — D860 Sarcoidosis of lung: Secondary | ICD-10-CM | POA: Diagnosis not present

## 2022-11-02 DIAGNOSIS — I119 Hypertensive heart disease without heart failure: Secondary | ICD-10-CM | POA: Diagnosis not present

## 2022-11-02 DIAGNOSIS — G319 Degenerative disease of nervous system, unspecified: Secondary | ICD-10-CM | POA: Diagnosis not present

## 2022-11-02 DIAGNOSIS — B9620 Unspecified Escherichia coli [E. coli] as the cause of diseases classified elsewhere: Secondary | ICD-10-CM | POA: Diagnosis not present

## 2022-11-02 DIAGNOSIS — Z9183 Wandering in diseases classified elsewhere: Secondary | ICD-10-CM | POA: Diagnosis not present

## 2022-11-02 DIAGNOSIS — M25562 Pain in left knee: Secondary | ICD-10-CM | POA: Diagnosis not present

## 2022-11-02 DIAGNOSIS — G9341 Metabolic encephalopathy: Secondary | ICD-10-CM | POA: Diagnosis not present

## 2022-11-02 DIAGNOSIS — F02818 Dementia in other diseases classified elsewhere, unspecified severity, with other behavioral disturbance: Secondary | ICD-10-CM | POA: Diagnosis not present

## 2022-11-02 DIAGNOSIS — N390 Urinary tract infection, site not specified: Secondary | ICD-10-CM | POA: Diagnosis not present

## 2022-11-10 DIAGNOSIS — G319 Degenerative disease of nervous system, unspecified: Secondary | ICD-10-CM | POA: Diagnosis not present

## 2022-11-10 DIAGNOSIS — Z9183 Wandering in diseases classified elsewhere: Secondary | ICD-10-CM | POA: Diagnosis not present

## 2022-11-10 DIAGNOSIS — M25562 Pain in left knee: Secondary | ICD-10-CM | POA: Diagnosis not present

## 2022-11-10 DIAGNOSIS — B9620 Unspecified Escherichia coli [E. coli] as the cause of diseases classified elsewhere: Secondary | ICD-10-CM | POA: Diagnosis not present

## 2022-11-10 DIAGNOSIS — G9341 Metabolic encephalopathy: Secondary | ICD-10-CM | POA: Diagnosis not present

## 2022-11-10 DIAGNOSIS — N390 Urinary tract infection, site not specified: Secondary | ICD-10-CM | POA: Diagnosis not present

## 2022-11-10 DIAGNOSIS — F02818 Dementia in other diseases classified elsewhere, unspecified severity, with other behavioral disturbance: Secondary | ICD-10-CM | POA: Diagnosis not present

## 2022-11-10 DIAGNOSIS — D860 Sarcoidosis of lung: Secondary | ICD-10-CM | POA: Diagnosis not present

## 2022-11-10 DIAGNOSIS — I119 Hypertensive heart disease without heart failure: Secondary | ICD-10-CM | POA: Diagnosis not present

## 2022-11-11 ENCOUNTER — Ambulatory Visit: Payer: Medicare PPO | Admitting: Neurology

## 2022-11-14 DIAGNOSIS — F02818 Dementia in other diseases classified elsewhere, unspecified severity, with other behavioral disturbance: Secondary | ICD-10-CM | POA: Diagnosis not present

## 2022-11-14 DIAGNOSIS — H269 Unspecified cataract: Secondary | ICD-10-CM | POA: Diagnosis not present

## 2022-11-14 DIAGNOSIS — G319 Degenerative disease of nervous system, unspecified: Secondary | ICD-10-CM | POA: Diagnosis not present

## 2022-11-14 DIAGNOSIS — Z87440 Personal history of urinary (tract) infections: Secondary | ICD-10-CM | POA: Diagnosis not present

## 2022-11-14 DIAGNOSIS — D860 Sarcoidosis of lung: Secondary | ICD-10-CM | POA: Diagnosis not present

## 2022-11-14 DIAGNOSIS — G9341 Metabolic encephalopathy: Secondary | ICD-10-CM | POA: Diagnosis not present

## 2022-11-14 DIAGNOSIS — Z79899 Other long term (current) drug therapy: Secondary | ICD-10-CM | POA: Diagnosis not present

## 2022-11-14 DIAGNOSIS — I119 Hypertensive heart disease without heart failure: Secondary | ICD-10-CM | POA: Diagnosis not present

## 2022-11-14 DIAGNOSIS — Z9183 Wandering in diseases classified elsewhere: Secondary | ICD-10-CM | POA: Diagnosis not present

## 2022-11-16 DIAGNOSIS — I10 Essential (primary) hypertension: Secondary | ICD-10-CM | POA: Diagnosis not present

## 2022-11-16 DIAGNOSIS — H524 Presbyopia: Secondary | ICD-10-CM | POA: Diagnosis not present

## 2022-11-16 DIAGNOSIS — H40013 Open angle with borderline findings, low risk, bilateral: Secondary | ICD-10-CM | POA: Diagnosis not present

## 2022-11-16 DIAGNOSIS — H35033 Hypertensive retinopathy, bilateral: Secondary | ICD-10-CM | POA: Diagnosis not present

## 2022-11-16 DIAGNOSIS — H25013 Cortical age-related cataract, bilateral: Secondary | ICD-10-CM | POA: Diagnosis not present

## 2022-11-16 DIAGNOSIS — R739 Hyperglycemia, unspecified: Secondary | ICD-10-CM | POA: Diagnosis not present

## 2022-11-16 DIAGNOSIS — R7989 Other specified abnormal findings of blood chemistry: Secondary | ICD-10-CM | POA: Diagnosis not present

## 2022-11-16 DIAGNOSIS — H2513 Age-related nuclear cataract, bilateral: Secondary | ICD-10-CM | POA: Diagnosis not present

## 2022-11-16 DIAGNOSIS — Z79899 Other long term (current) drug therapy: Secondary | ICD-10-CM | POA: Diagnosis not present

## 2022-11-17 DIAGNOSIS — Z9183 Wandering in diseases classified elsewhere: Secondary | ICD-10-CM | POA: Diagnosis not present

## 2022-11-17 DIAGNOSIS — H269 Unspecified cataract: Secondary | ICD-10-CM | POA: Diagnosis not present

## 2022-11-17 DIAGNOSIS — D860 Sarcoidosis of lung: Secondary | ICD-10-CM | POA: Diagnosis not present

## 2022-11-17 DIAGNOSIS — I119 Hypertensive heart disease without heart failure: Secondary | ICD-10-CM | POA: Diagnosis not present

## 2022-11-17 DIAGNOSIS — Z79899 Other long term (current) drug therapy: Secondary | ICD-10-CM | POA: Diagnosis not present

## 2022-11-17 DIAGNOSIS — G319 Degenerative disease of nervous system, unspecified: Secondary | ICD-10-CM | POA: Diagnosis not present

## 2022-11-17 DIAGNOSIS — Z87440 Personal history of urinary (tract) infections: Secondary | ICD-10-CM | POA: Diagnosis not present

## 2022-11-17 DIAGNOSIS — G9341 Metabolic encephalopathy: Secondary | ICD-10-CM | POA: Diagnosis not present

## 2022-11-17 DIAGNOSIS — F02818 Dementia in other diseases classified elsewhere, unspecified severity, with other behavioral disturbance: Secondary | ICD-10-CM | POA: Diagnosis not present

## 2022-11-19 DIAGNOSIS — Z9183 Wandering in diseases classified elsewhere: Secondary | ICD-10-CM | POA: Diagnosis not present

## 2022-11-19 DIAGNOSIS — F02818 Dementia in other diseases classified elsewhere, unspecified severity, with other behavioral disturbance: Secondary | ICD-10-CM | POA: Diagnosis not present

## 2022-11-19 DIAGNOSIS — G319 Degenerative disease of nervous system, unspecified: Secondary | ICD-10-CM | POA: Diagnosis not present

## 2022-11-19 DIAGNOSIS — D860 Sarcoidosis of lung: Secondary | ICD-10-CM | POA: Diagnosis not present

## 2022-11-19 DIAGNOSIS — Z87440 Personal history of urinary (tract) infections: Secondary | ICD-10-CM | POA: Diagnosis not present

## 2022-11-19 DIAGNOSIS — Z79899 Other long term (current) drug therapy: Secondary | ICD-10-CM | POA: Diagnosis not present

## 2022-11-19 DIAGNOSIS — G9341 Metabolic encephalopathy: Secondary | ICD-10-CM | POA: Diagnosis not present

## 2022-11-19 DIAGNOSIS — I119 Hypertensive heart disease without heart failure: Secondary | ICD-10-CM | POA: Diagnosis not present

## 2022-11-19 DIAGNOSIS — H269 Unspecified cataract: Secondary | ICD-10-CM | POA: Diagnosis not present

## 2022-11-22 DIAGNOSIS — Z79899 Other long term (current) drug therapy: Secondary | ICD-10-CM | POA: Diagnosis not present

## 2022-11-22 DIAGNOSIS — Z87440 Personal history of urinary (tract) infections: Secondary | ICD-10-CM | POA: Diagnosis not present

## 2022-11-22 DIAGNOSIS — I119 Hypertensive heart disease without heart failure: Secondary | ICD-10-CM | POA: Diagnosis not present

## 2022-11-22 DIAGNOSIS — F02818 Dementia in other diseases classified elsewhere, unspecified severity, with other behavioral disturbance: Secondary | ICD-10-CM | POA: Diagnosis not present

## 2022-11-22 DIAGNOSIS — Z9183 Wandering in diseases classified elsewhere: Secondary | ICD-10-CM | POA: Diagnosis not present

## 2022-11-22 DIAGNOSIS — G9341 Metabolic encephalopathy: Secondary | ICD-10-CM | POA: Diagnosis not present

## 2022-11-22 DIAGNOSIS — H269 Unspecified cataract: Secondary | ICD-10-CM | POA: Diagnosis not present

## 2022-11-22 DIAGNOSIS — G319 Degenerative disease of nervous system, unspecified: Secondary | ICD-10-CM | POA: Diagnosis not present

## 2022-11-22 DIAGNOSIS — D860 Sarcoidosis of lung: Secondary | ICD-10-CM | POA: Diagnosis not present

## 2022-11-23 DIAGNOSIS — I77819 Aortic ectasia, unspecified site: Secondary | ICD-10-CM | POA: Diagnosis not present

## 2022-11-23 DIAGNOSIS — R319 Hematuria, unspecified: Secondary | ICD-10-CM | POA: Diagnosis not present

## 2022-11-23 DIAGNOSIS — M1990 Unspecified osteoarthritis, unspecified site: Secondary | ICD-10-CM | POA: Diagnosis not present

## 2022-11-23 DIAGNOSIS — D869 Sarcoidosis, unspecified: Secondary | ICD-10-CM | POA: Diagnosis not present

## 2022-11-23 DIAGNOSIS — Z0000 Encounter for general adult medical examination without abnormal findings: Secondary | ICD-10-CM | POA: Diagnosis not present

## 2022-11-23 DIAGNOSIS — F0390 Unspecified dementia without behavioral disturbance: Secondary | ICD-10-CM | POA: Diagnosis not present

## 2022-11-23 DIAGNOSIS — I10 Essential (primary) hypertension: Secondary | ICD-10-CM | POA: Diagnosis not present

## 2022-11-23 DIAGNOSIS — M25569 Pain in unspecified knee: Secondary | ICD-10-CM | POA: Diagnosis not present

## 2022-11-23 DIAGNOSIS — M8580 Other specified disorders of bone density and structure, unspecified site: Secondary | ICD-10-CM | POA: Diagnosis not present

## 2022-11-24 DIAGNOSIS — D860 Sarcoidosis of lung: Secondary | ICD-10-CM | POA: Diagnosis not present

## 2022-11-24 DIAGNOSIS — G9341 Metabolic encephalopathy: Secondary | ICD-10-CM | POA: Diagnosis not present

## 2022-11-24 DIAGNOSIS — Z9183 Wandering in diseases classified elsewhere: Secondary | ICD-10-CM | POA: Diagnosis not present

## 2022-11-24 DIAGNOSIS — F02818 Dementia in other diseases classified elsewhere, unspecified severity, with other behavioral disturbance: Secondary | ICD-10-CM | POA: Diagnosis not present

## 2022-11-24 DIAGNOSIS — Z87440 Personal history of urinary (tract) infections: Secondary | ICD-10-CM | POA: Diagnosis not present

## 2022-11-24 DIAGNOSIS — I119 Hypertensive heart disease without heart failure: Secondary | ICD-10-CM | POA: Diagnosis not present

## 2022-11-24 DIAGNOSIS — H269 Unspecified cataract: Secondary | ICD-10-CM | POA: Diagnosis not present

## 2022-11-24 DIAGNOSIS — Z79899 Other long term (current) drug therapy: Secondary | ICD-10-CM | POA: Diagnosis not present

## 2022-11-24 DIAGNOSIS — G319 Degenerative disease of nervous system, unspecified: Secondary | ICD-10-CM | POA: Diagnosis not present

## 2022-11-29 DIAGNOSIS — H269 Unspecified cataract: Secondary | ICD-10-CM | POA: Diagnosis not present

## 2022-11-29 DIAGNOSIS — Z79899 Other long term (current) drug therapy: Secondary | ICD-10-CM | POA: Diagnosis not present

## 2022-11-29 DIAGNOSIS — Z87440 Personal history of urinary (tract) infections: Secondary | ICD-10-CM | POA: Diagnosis not present

## 2022-11-29 DIAGNOSIS — Z9183 Wandering in diseases classified elsewhere: Secondary | ICD-10-CM | POA: Diagnosis not present

## 2022-11-29 DIAGNOSIS — G319 Degenerative disease of nervous system, unspecified: Secondary | ICD-10-CM | POA: Diagnosis not present

## 2022-11-29 DIAGNOSIS — D860 Sarcoidosis of lung: Secondary | ICD-10-CM | POA: Diagnosis not present

## 2022-11-29 DIAGNOSIS — G9341 Metabolic encephalopathy: Secondary | ICD-10-CM | POA: Diagnosis not present

## 2022-11-29 DIAGNOSIS — F02818 Dementia in other diseases classified elsewhere, unspecified severity, with other behavioral disturbance: Secondary | ICD-10-CM | POA: Diagnosis not present

## 2022-11-29 DIAGNOSIS — I119 Hypertensive heart disease without heart failure: Secondary | ICD-10-CM | POA: Diagnosis not present

## 2022-12-01 DIAGNOSIS — H269 Unspecified cataract: Secondary | ICD-10-CM | POA: Diagnosis not present

## 2022-12-01 DIAGNOSIS — I119 Hypertensive heart disease without heart failure: Secondary | ICD-10-CM | POA: Diagnosis not present

## 2022-12-01 DIAGNOSIS — Z87440 Personal history of urinary (tract) infections: Secondary | ICD-10-CM | POA: Diagnosis not present

## 2022-12-01 DIAGNOSIS — G319 Degenerative disease of nervous system, unspecified: Secondary | ICD-10-CM | POA: Diagnosis not present

## 2022-12-01 DIAGNOSIS — D860 Sarcoidosis of lung: Secondary | ICD-10-CM | POA: Diagnosis not present

## 2022-12-01 DIAGNOSIS — G9341 Metabolic encephalopathy: Secondary | ICD-10-CM | POA: Diagnosis not present

## 2022-12-01 DIAGNOSIS — Z79899 Other long term (current) drug therapy: Secondary | ICD-10-CM | POA: Diagnosis not present

## 2022-12-01 DIAGNOSIS — F02818 Dementia in other diseases classified elsewhere, unspecified severity, with other behavioral disturbance: Secondary | ICD-10-CM | POA: Diagnosis not present

## 2022-12-01 DIAGNOSIS — Z9183 Wandering in diseases classified elsewhere: Secondary | ICD-10-CM | POA: Diagnosis not present

## 2022-12-10 DIAGNOSIS — Z79899 Other long term (current) drug therapy: Secondary | ICD-10-CM | POA: Diagnosis not present

## 2022-12-10 DIAGNOSIS — Z87440 Personal history of urinary (tract) infections: Secondary | ICD-10-CM | POA: Diagnosis not present

## 2022-12-10 DIAGNOSIS — G9341 Metabolic encephalopathy: Secondary | ICD-10-CM | POA: Diagnosis not present

## 2022-12-10 DIAGNOSIS — G319 Degenerative disease of nervous system, unspecified: Secondary | ICD-10-CM | POA: Diagnosis not present

## 2022-12-10 DIAGNOSIS — I119 Hypertensive heart disease without heart failure: Secondary | ICD-10-CM | POA: Diagnosis not present

## 2022-12-10 DIAGNOSIS — H269 Unspecified cataract: Secondary | ICD-10-CM | POA: Diagnosis not present

## 2022-12-10 DIAGNOSIS — F02818 Dementia in other diseases classified elsewhere, unspecified severity, with other behavioral disturbance: Secondary | ICD-10-CM | POA: Diagnosis not present

## 2022-12-10 DIAGNOSIS — Z9183 Wandering in diseases classified elsewhere: Secondary | ICD-10-CM | POA: Diagnosis not present

## 2022-12-10 DIAGNOSIS — D860 Sarcoidosis of lung: Secondary | ICD-10-CM | POA: Diagnosis not present

## 2022-12-14 DIAGNOSIS — G9341 Metabolic encephalopathy: Secondary | ICD-10-CM | POA: Diagnosis not present

## 2022-12-14 DIAGNOSIS — Z87440 Personal history of urinary (tract) infections: Secondary | ICD-10-CM | POA: Diagnosis not present

## 2022-12-14 DIAGNOSIS — G319 Degenerative disease of nervous system, unspecified: Secondary | ICD-10-CM | POA: Diagnosis not present

## 2022-12-14 DIAGNOSIS — Z79899 Other long term (current) drug therapy: Secondary | ICD-10-CM | POA: Diagnosis not present

## 2022-12-14 DIAGNOSIS — F02818 Dementia in other diseases classified elsewhere, unspecified severity, with other behavioral disturbance: Secondary | ICD-10-CM | POA: Diagnosis not present

## 2022-12-14 DIAGNOSIS — H269 Unspecified cataract: Secondary | ICD-10-CM | POA: Diagnosis not present

## 2022-12-14 DIAGNOSIS — Z9183 Wandering in diseases classified elsewhere: Secondary | ICD-10-CM | POA: Diagnosis not present

## 2022-12-14 DIAGNOSIS — D860 Sarcoidosis of lung: Secondary | ICD-10-CM | POA: Diagnosis not present

## 2022-12-14 DIAGNOSIS — I119 Hypertensive heart disease without heart failure: Secondary | ICD-10-CM | POA: Diagnosis not present

## 2022-12-22 DIAGNOSIS — M25561 Pain in right knee: Secondary | ICD-10-CM | POA: Diagnosis not present

## 2022-12-22 DIAGNOSIS — M170 Bilateral primary osteoarthritis of knee: Secondary | ICD-10-CM | POA: Diagnosis not present

## 2023-02-02 DIAGNOSIS — R051 Acute cough: Secondary | ICD-10-CM | POA: Diagnosis not present

## 2023-02-02 DIAGNOSIS — J069 Acute upper respiratory infection, unspecified: Secondary | ICD-10-CM | POA: Diagnosis not present

## 2023-02-02 DIAGNOSIS — Z1152 Encounter for screening for COVID-19: Secondary | ICD-10-CM | POA: Diagnosis not present

## 2023-02-02 DIAGNOSIS — R627 Adult failure to thrive: Secondary | ICD-10-CM | POA: Diagnosis not present

## 2023-02-02 DIAGNOSIS — I10 Essential (primary) hypertension: Secondary | ICD-10-CM | POA: Diagnosis not present

## 2023-02-02 DIAGNOSIS — J309 Allergic rhinitis, unspecified: Secondary | ICD-10-CM | POA: Diagnosis not present

## 2023-02-02 DIAGNOSIS — D869 Sarcoidosis, unspecified: Secondary | ICD-10-CM | POA: Diagnosis not present

## 2023-02-02 DIAGNOSIS — F0390 Unspecified dementia without behavioral disturbance: Secondary | ICD-10-CM | POA: Diagnosis not present

## 2023-04-06 DIAGNOSIS — Z111 Encounter for screening for respiratory tuberculosis: Secondary | ICD-10-CM | POA: Diagnosis not present

## 2023-04-18 ENCOUNTER — Encounter: Payer: Self-pay | Admitting: Neurology

## 2023-04-18 ENCOUNTER — Ambulatory Visit: Payer: Medicare PPO | Admitting: Neurology

## 2023-04-18 VITALS — BP 114/70 | Ht 64.0 in | Wt 131.0 lb

## 2023-04-18 DIAGNOSIS — G301 Alzheimer's disease with late onset: Secondary | ICD-10-CM | POA: Diagnosis not present

## 2023-04-18 DIAGNOSIS — F02A3 Dementia in other diseases classified elsewhere, mild, with mood disturbance: Secondary | ICD-10-CM | POA: Diagnosis not present

## 2023-04-18 NOTE — Progress Notes (Signed)
[facility]  PATIENT: [name] Combs DOB: [birth year]  REQUESTING CLINICIAN: Creola Corn, MD HISTORY FROM: Patient, son and daughter via phone  REASON FOR VISIT: Worsening memory    HISTORICAL  CHIEF COMPLAINT:  Chief Complaint  Patient presents with   Follow-up    Rm 14, with daughter Wynona Canes, follow up memory. Daughter reports moe forgetfulness this week with short term memory   INTERVAL HISTORY 04/18/2023 Patient presents today for follow-up, she is accompanied by her daughter Wynona Canes.  Daughter reports that her memory has been stable, but this week it has been worse. Patient is at home, both daughter and son do help her.  She is not on any medicine for memory, again they would like to delay at the moment.  No major concerns.  She is able to fix her meal using the microwave, make a plate, she does take naps during the day and denies any disruption in her sleep cycle.  No issues with her self care. Daughter took over the finances. They deny any fall at home.   HISTORY OF PRESENT ILLNESS:  This is a 82 year old woman past medical history of hypertension, cataract and pulmonary sarcoidosis who is presenting with memory changes.  Patient is accompanied by her son and also her daughter was on the phone.  Per family patient has been having memory issues for the past year.  Daughter described it as being forgetful, repetitive, asking the same questions over and over.  Daughter reports that at times during a phone call, patient can tell the same story multiple times.  At that time she was still independent, living alone, but daughter felt like she was not taking her medication correctly and daughter has also noted at time  she will also forget to cook or eat.  Due to that she has been giving her microwavable food and has been providing food for her.   On September 28 family has realized that patient has left the house, initially she states she was going to the post office but  came back 12 hours later and it seemed like patient drove all the way to [place] before coming home when asked patient said that she went to [place] for a conference.  Son has found her disheveled, only wearing her underwear, son noted that she had urine incontinence and took her to the hospital where she was diagnosed with UTI.  Her acute confusional state was thought to be secondary to UTI and patient was given antibiotics.  Since discharge son has been living with patient, he has noted personality change that she is combative and angry about her son helping her with her medication.  They have not let her drive but patient still thinks that she is driving.  She does repeat herself and asks the same questions over and over.  In terms of the infection, it is improving but feel they feel like her memory has gotten worse and has not recover.    TBI:   No past history of TBI Stroke:   no past history of stroke Seizures:   no past history of seizures Sleep:   no history of sleep apnea.   Mood: Slightly agitated per son.  Family history of Dementia:  Denies  Functional status: independent in some ADLs and IADLs Patient lives alone but son has been staying with her in the past month. 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 denies Sleep: no issues   OTHER MEDICAL CONDITIONS: Hypertension, pulmonary sarcoidosis    REVIEW OF SYSTEMS: Full 14 system review of systems performed and negative with exception of: As noted in the HPI   ALLERGIES: Allergies  Allergen Reactions   Codeine Cough   Sesame Seed (Diagnostic)  Hives    HOME MEDICATIONS: Outpatient Medications Prior to Visit  Medication Sig Dispense Refill   amLODipine (NORVASC) 10 MG tablet Take 1 tablet (10 mg total) by mouth at bedtime. (Patient taking differently: Take 5 mg by mouth at bedtime.) 30 tablet 2   fluticasone (FLONASE) 50 MCG/ACT nasal spray Place 1 spray into both nostrils daily. 16 g 0   Multiple Vitamin (MULTIVITAMIN) capsule Take 1 capsule by mouth daily.     sertraline (ZOLOFT) 50 MG tablet Take 50 mg by mouth daily.     albuterol (VENTOLIN HFA) 108 (90 Base) MCG/ACT inhaler Inhale 1-2 puffs into the lungs every 6 (six) hours as needed for wheezing or shortness of breath. (Patient not taking: Reported on 04/18/2023) 6.7 g 0   benzonatate (TESSALON) 100 MG capsule Take 1 capsule (100 mg total) by mouth every 8 (eight) hours. (Patient not taking: Reported on 04/18/2023) 21 capsule 0   No facility-administered medications prior to visit.    PAST MEDICAL HISTORY: Past Medical History:  Diagnosis Date   Cataract    Hypertension    Sarcoidosis     PAST SURGICAL HISTORY: History reviewed. No pertinent surgical history.  FAMILY HISTORY: History reviewed. No pertinent family history.  SOCIAL HISTORY: Social History   Socioeconomic History   Marital status: Widowed    Spouse name: Not on file   Number of children: Not on file   Years of education: Not on file   Highest education level: Not on file  Occupational History   Not on file  Tobacco Use   Smoking status: Never   Smokeless tobacco: Never  Vaping Use   Vaping Use: Never used  Substance and Sexual Activity   Alcohol use: Never   Drug use: Never   Sexual activity: Not on file  Other Topics Concern   Not on file  Social History Narrative   Lives at home with daughter and son rotating care   Right handed   Caffeine-occasional chocolate candy   Social Determinants of Health   Financial Resource Strain: Not on file  Food Insecurity: Not on file   Transportation Needs: Not on file  Physical Activity: Not on file  Stress: Not on file  Social Connections: Not on file  Intimate Partner Violence: Not on file    PHYSICAL EXAM  GENERAL EXAM/CONSTITUTIONAL: Vitals:  Vitals:   04/18/23 1014  BP: 114/70  Weight: 131 lb (59.4 kg)  Height: 5\' 4"  (1.626 m)    Body mass index is 22.49 kg/m. Wt Readings from Last 3 Encounters:  04/18/23 131 lb (59.4 kg)  10/13/22 118 lb (53.5 kg)  09/10/22 119 lb 0.8 oz (54 kg)   Patient is in no distress; well developed, nourished and groomed; neck is supple  MUSCULOSKELETAL: Gait, strength, tone, movements noted in Neurologic exam below  NEUROLOGIC: MENTAL STATUS:     04/18/2023   10:16 AM  MMSE - Mini  Mental State Exam  Orientation to time 1  Orientation to Place 4  Registration 0  Attention/ Calculation 0  Recall 0  Language- name 2 objects 2  Language- repeat 1  Language- follow 3 step command 3  Language- read & follow direction 1  Write a sentence 1  Copy design 0  Total score 13      10/13/2022    8:32 AM  Montreal Cognitive Assessment   Visuospatial/ Executive (0/5) 3  Naming (0/3) 3  Attention: Read list of digits (0/2) 1  Attention: Read list of letters (0/1) 1  Attention: Serial 7 subtraction starting at 100 (0/3) 3  Language: Repeat phrase (0/2) 1  Language : Fluency (0/1) 1  Abstraction (0/2) 2  Delayed Recall (0/5) 0  Orientation (0/6) 3  Total 18     CRANIAL NERVE:  2nd, 3rd, 4th, 6th - visual fields full to confrontation, extraocular muscles intact, no nystagmus 5th - facial sensation symmetric 7th - facial strength symmetric 8th - hearing intact 9th - palate elevates symmetrically, uvula midline 11th - shoulder shrug symmetric 12th - tongue protrusion midline  MOTOR:  normal bulk and tone, full strength in the BUE, BLE. LUE limited by pain  COORDINATION:  finger-nose-finger, fine finger movements normal  GAIT/STATION:  With a cane      DIAGNOSTIC DATA (LABS, IMAGING, TESTING) - I reviewed patient records, labs, notes, testing and imaging myself where available.  Lab Results  Component Value Date   WBC 5.4 09/11/2022   HGB 13.2 09/11/2022   HCT 40.5 09/11/2022   MCV 91.8 09/11/2022   PLT 311 09/11/2022      Component Value Date/Time   NA 140 09/11/2022 0723   K 3.9 09/11/2022 0723   CL 103 09/11/2022 0723   CO2 28 09/11/2022 0723   GLUCOSE 94 09/11/2022 0723   BUN 10 09/11/2022 0723   CREATININE 0.65 09/11/2022 0723   CALCIUM 9.5 09/11/2022 0723   PROT 7.7 09/09/2022 1530   ALBUMIN 3.8 09/09/2022 1530   AST 17 09/09/2022 1530   ALT 6 09/09/2022 1530   ALKPHOS 80 09/09/2022 1530   BILITOT 0.9 09/09/2022 1530   GFRNONAA >60 09/11/2022 0723   No results found for: "CHOL", "HDL", "LDLCALC", "LDLDIRECT", "TRIG", "CHOLHDL" No results found for: "HGBA1C" No results found for: "VITAMINB12" Lab Results  Component Value Date   TSH 2.348 09/09/2022    CT Head 09/09/22 Atrophy with small vessel chronic ischemic changes of deep cerebral white matter. No acute intracranial abnormalities   ASSESSMENT AND PLAN  82 y.o. year old female with history of hypertension, pulmonary sarcoidosis who is presenting for follow up for her Mild Alzheimer dementia.  Overall she is stable, family still would like to delay starting medication.  She is on Zoloft currently.  Family denies falls, no sleep cycle disruption.  Again discussed medications including Aricept, Namenda, and the new Lecanemab.  Based on MoCA score patient is not a candidate for Lecanemab.  Family again would like to hold starting any medications for Alzheimer dementia.  At this time we will continue to observe patient off medication but I do advise them to contact me if her condition get worse or if they would like to start medication, they voiced understanding. Continue to follow up with PCP and return as needed.    1. 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  Exercise Daily Walk A daily 20 minute walk should be part of your routine. Disease related apathy can be a significant roadblock to exercise and the only way to overcome this is to make it a daily routine and perhaps have a reward at the end (something your loved one loves to eat or drink perhaps) or a personal trainer coming to the home can also be very useful. Most importantly, the patient is much more likely to exercise if the caregiver / spouse does it with him/her. In general a structured, repetitive schedule is best.  General Health: Any diseases which effect your body will effect your brain such as a pneumonia, urinary infection, blood clot, heart attack or stroke. Keep contact with your primary care doctor for regular follow ups.  Sleep. A good nights sleep is healthy for the brain. Seven hours is recommended. If you have insomnia or poor sleep habits we can give you some instructions. If you have sleep apnea wear your mask.  Diet: Eating a heart healthy diet is also a good idea; fish and poultry instead of red meat, nuts (mostly non-peanuts), vegetables, fruits, olive oil or canola oil (instead of butter), minimal salt (use other spices to flavor foods), whole grain rice, bread, cereal and pasta and wine in moderation.Research is now showing that the MIND diet, which is a combination of The Mediterranean diet and the DASH diet, is beneficial for cognitive processing and longevity. Information about this diet can be found in The MIND Diet, a book by Alonna Minium, MS, RDN, and online at [employer].es  Finances, Power of [address] and Advance Directives: You should consider putting legal safeguards in place with regard to financial and  medical decision making. While the spouse always has power of attorney for medical and financial issues in the absence of any form, you should consider what you want in case the spouse / caregiver is no longer around or capable of making decisions.     No orders of the defined types were placed in this encounter.   No orders of the defined types were placed in this encounter.   Return if symptoms worsen or fail to improve.  I have spent a total of 45 minutes dedicated to this patient today, preparing to see patient, performing a medically appropriate examination and evaluation, ordering tests and/or medications and procedures, and counseling and educating the patient/family/caregiver; independently interpreting result and communicating results to the family/patient/caregiver; and documenting clinical information in the electronic medical record.   Windell Norfolk, MD 04/18/2023, 11:04 AM  [facility] [address] [place], [place] [address] [phone]

## 2023-04-18 NOTE — Patient Instructions (Signed)
Continue current medications Follow-up as needed   There are well-accepted and sensible ways to reduce risk for Alzheimers disease and other degenerative brain disorders .  Exercise Daily Walk A daily 20 minute walk should be part of your routine. Disease related apathy can be a significant roadblock to exercise and the only way to overcome this is to make it a daily routine and perhaps have a reward at the end (something your loved one loves to eat or drink perhaps) or a personal trainer coming to the home can also be very useful. Most importantly, the patient is much more likely to exercise if the caregiver / spouse does it with him/her. In general a structured, repetitive schedule is best.  General Health: Any diseases which effect your body will effect your brain such as a pneumonia, urinary infection, blood clot, heart attack or stroke. Keep contact with your primary care doctor for regular follow ups.  Sleep. A good nights sleep is healthy for the brain. Seven hours is recommended. If you have insomnia or poor sleep habits we can give you some instructions. If you have sleep apnea wear your mask.  Diet: Eating a heart healthy diet is also a good idea; fish and poultry instead of red meat, nuts (mostly non-peanuts), vegetables, fruits, olive oil or canola oil (instead of butter), minimal salt (use other spices to flavor foods), whole grain rice, bread, cereal and pasta and wine in moderation.Research is now showing that the MIND diet, which is a combination of The Mediterranean diet and the DASH diet, is beneficial for cognitive processing and longevity. Information about this diet can be found in The MIND Diet, a book by Maggie Moon, MS, RDN, and online at https://www.healthline.com/nutrition/mind-diet  Finances, Power of Attorney and Advance Directives: You should consider putting legal safeguards in place with regard to financial and medical decision making. While the spouse always has power of  attorney for medical and financial issues in the absence of any form, you should consider what you want in case the spouse / caregiver is no longer around or capable of making decisions 

## 2023-05-14 DIAGNOSIS — M1610 Unilateral primary osteoarthritis, unspecified hip: Secondary | ICD-10-CM | POA: Diagnosis not present

## 2023-05-14 DIAGNOSIS — F0390 Unspecified dementia without behavioral disturbance: Secondary | ICD-10-CM | POA: Diagnosis not present

## 2023-05-14 DIAGNOSIS — F411 Generalized anxiety disorder: Secondary | ICD-10-CM | POA: Diagnosis not present

## 2023-07-07 DIAGNOSIS — N390 Urinary tract infection, site not specified: Secondary | ICD-10-CM | POA: Diagnosis not present

## 2023-07-08 DIAGNOSIS — F411 Generalized anxiety disorder: Secondary | ICD-10-CM | POA: Diagnosis not present

## 2023-07-08 DIAGNOSIS — M1610 Unilateral primary osteoarthritis, unspecified hip: Secondary | ICD-10-CM | POA: Diagnosis not present

## 2023-07-08 DIAGNOSIS — R2681 Unsteadiness on feet: Secondary | ICD-10-CM | POA: Diagnosis not present

## 2023-07-08 DIAGNOSIS — F0390 Unspecified dementia without behavioral disturbance: Secondary | ICD-10-CM | POA: Diagnosis not present

## 2023-07-14 DIAGNOSIS — F411 Generalized anxiety disorder: Secondary | ICD-10-CM | POA: Diagnosis not present

## 2023-07-14 DIAGNOSIS — F0390 Unspecified dementia without behavioral disturbance: Secondary | ICD-10-CM | POA: Diagnosis not present

## 2023-07-14 DIAGNOSIS — M1610 Unilateral primary osteoarthritis, unspecified hip: Secondary | ICD-10-CM | POA: Diagnosis not present

## 2023-07-14 DIAGNOSIS — R2681 Unsteadiness on feet: Secondary | ICD-10-CM | POA: Diagnosis not present

## 2023-07-15 DIAGNOSIS — F0390 Unspecified dementia without behavioral disturbance: Secondary | ICD-10-CM | POA: Diagnosis not present

## 2023-07-15 DIAGNOSIS — F411 Generalized anxiety disorder: Secondary | ICD-10-CM | POA: Diagnosis not present

## 2023-07-15 DIAGNOSIS — M1610 Unilateral primary osteoarthritis, unspecified hip: Secondary | ICD-10-CM | POA: Diagnosis not present

## 2023-07-15 DIAGNOSIS — R2681 Unsteadiness on feet: Secondary | ICD-10-CM | POA: Diagnosis not present

## 2023-07-20 DIAGNOSIS — M1610 Unilateral primary osteoarthritis, unspecified hip: Secondary | ICD-10-CM | POA: Diagnosis not present

## 2023-07-20 DIAGNOSIS — F0390 Unspecified dementia without behavioral disturbance: Secondary | ICD-10-CM | POA: Diagnosis not present

## 2023-07-20 DIAGNOSIS — R2681 Unsteadiness on feet: Secondary | ICD-10-CM | POA: Diagnosis not present

## 2023-07-20 DIAGNOSIS — F411 Generalized anxiety disorder: Secondary | ICD-10-CM | POA: Diagnosis not present

## 2023-07-22 DIAGNOSIS — U071 COVID-19: Secondary | ICD-10-CM | POA: Diagnosis not present

## 2023-08-04 DIAGNOSIS — F411 Generalized anxiety disorder: Secondary | ICD-10-CM | POA: Diagnosis not present

## 2023-08-04 DIAGNOSIS — M1610 Unilateral primary osteoarthritis, unspecified hip: Secondary | ICD-10-CM | POA: Diagnosis not present

## 2023-08-04 DIAGNOSIS — R2681 Unsteadiness on feet: Secondary | ICD-10-CM | POA: Diagnosis not present

## 2023-08-04 DIAGNOSIS — F0390 Unspecified dementia without behavioral disturbance: Secondary | ICD-10-CM | POA: Diagnosis not present

## 2023-08-04 DIAGNOSIS — R051 Acute cough: Secondary | ICD-10-CM | POA: Diagnosis not present

## 2023-08-05 DIAGNOSIS — R2681 Unsteadiness on feet: Secondary | ICD-10-CM | POA: Diagnosis not present

## 2023-08-05 DIAGNOSIS — F411 Generalized anxiety disorder: Secondary | ICD-10-CM | POA: Diagnosis not present

## 2023-08-05 DIAGNOSIS — F0390 Unspecified dementia without behavioral disturbance: Secondary | ICD-10-CM | POA: Diagnosis not present

## 2023-08-05 DIAGNOSIS — M1610 Unilateral primary osteoarthritis, unspecified hip: Secondary | ICD-10-CM | POA: Diagnosis not present

## 2023-08-09 DIAGNOSIS — F411 Generalized anxiety disorder: Secondary | ICD-10-CM | POA: Diagnosis not present

## 2023-08-09 DIAGNOSIS — F0390 Unspecified dementia without behavioral disturbance: Secondary | ICD-10-CM | POA: Diagnosis not present

## 2023-08-09 DIAGNOSIS — M1610 Unilateral primary osteoarthritis, unspecified hip: Secondary | ICD-10-CM | POA: Diagnosis not present

## 2023-08-09 DIAGNOSIS — R2681 Unsteadiness on feet: Secondary | ICD-10-CM | POA: Diagnosis not present

## 2023-09-30 DIAGNOSIS — S0990XA Unspecified injury of head, initial encounter: Secondary | ICD-10-CM | POA: Diagnosis not present

## 2023-09-30 DIAGNOSIS — M25511 Pain in right shoulder: Secondary | ICD-10-CM | POA: Diagnosis not present

## 2023-09-30 DIAGNOSIS — M25512 Pain in left shoulder: Secondary | ICD-10-CM | POA: Diagnosis not present

## 2023-09-30 DIAGNOSIS — M19011 Primary osteoarthritis, right shoulder: Secondary | ICD-10-CM | POA: Diagnosis not present

## 2023-09-30 DIAGNOSIS — F0390 Unspecified dementia without behavioral disturbance: Secondary | ICD-10-CM | POA: Diagnosis not present

## 2023-09-30 DIAGNOSIS — G8929 Other chronic pain: Secondary | ICD-10-CM | POA: Diagnosis not present

## 2023-09-30 DIAGNOSIS — W1830XA Fall on same level, unspecified, initial encounter: Secondary | ICD-10-CM | POA: Diagnosis not present

## 2023-09-30 DIAGNOSIS — M19012 Primary osteoarthritis, left shoulder: Secondary | ICD-10-CM | POA: Diagnosis not present

## 2023-10-01 DIAGNOSIS — R630 Anorexia: Secondary | ICD-10-CM | POA: Diagnosis not present

## 2023-10-01 DIAGNOSIS — F0390 Unspecified dementia without behavioral disturbance: Secondary | ICD-10-CM | POA: Diagnosis not present

## 2023-10-01 DIAGNOSIS — M1610 Unilateral primary osteoarthritis, unspecified hip: Secondary | ICD-10-CM | POA: Diagnosis not present

## 2023-10-01 DIAGNOSIS — F411 Generalized anxiety disorder: Secondary | ICD-10-CM | POA: Diagnosis not present

## 2023-10-10 DIAGNOSIS — G309 Alzheimer's disease, unspecified: Secondary | ICD-10-CM | POA: Diagnosis not present

## 2023-10-10 DIAGNOSIS — F02B0 Dementia in other diseases classified elsewhere, moderate, without behavioral disturbance, psychotic disturbance, mood disturbance, and anxiety: Secondary | ICD-10-CM | POA: Diagnosis not present

## 2023-10-10 DIAGNOSIS — F39 Unspecified mood [affective] disorder: Secondary | ICD-10-CM | POA: Diagnosis not present

## 2023-10-10 DIAGNOSIS — R8290 Unspecified abnormal findings in urine: Secondary | ICD-10-CM | POA: Diagnosis not present

## 2023-10-10 DIAGNOSIS — I10 Essential (primary) hypertension: Secondary | ICD-10-CM | POA: Diagnosis not present

## 2023-10-10 DIAGNOSIS — M25512 Pain in left shoulder: Secondary | ICD-10-CM | POA: Diagnosis not present

## 2023-10-15 DIAGNOSIS — M1610 Unilateral primary osteoarthritis, unspecified hip: Secondary | ICD-10-CM | POA: Diagnosis not present

## 2023-10-15 DIAGNOSIS — R630 Anorexia: Secondary | ICD-10-CM | POA: Diagnosis not present

## 2023-10-15 DIAGNOSIS — F0390 Unspecified dementia without behavioral disturbance: Secondary | ICD-10-CM | POA: Diagnosis not present

## 2023-10-15 DIAGNOSIS — F411 Generalized anxiety disorder: Secondary | ICD-10-CM | POA: Diagnosis not present

## 2023-10-31 DIAGNOSIS — M19012 Primary osteoarthritis, left shoulder: Secondary | ICD-10-CM | POA: Diagnosis not present

## 2023-10-31 DIAGNOSIS — M19011 Primary osteoarthritis, right shoulder: Secondary | ICD-10-CM | POA: Diagnosis not present

## 2023-11-22 DIAGNOSIS — Z131 Encounter for screening for diabetes mellitus: Secondary | ICD-10-CM | POA: Diagnosis not present

## 2023-11-22 DIAGNOSIS — R21 Rash and other nonspecific skin eruption: Secondary | ICD-10-CM | POA: Diagnosis not present

## 2023-11-22 DIAGNOSIS — Z1321 Encounter for screening for nutritional disorder: Secondary | ICD-10-CM | POA: Diagnosis not present

## 2023-11-22 DIAGNOSIS — I10 Essential (primary) hypertension: Secondary | ICD-10-CM | POA: Diagnosis not present

## 2023-11-22 DIAGNOSIS — F02B0 Dementia in other diseases classified elsewhere, moderate, without behavioral disturbance, psychotic disturbance, mood disturbance, and anxiety: Secondary | ICD-10-CM | POA: Diagnosis not present

## 2023-11-22 DIAGNOSIS — Z0000 Encounter for general adult medical examination without abnormal findings: Secondary | ICD-10-CM | POA: Diagnosis not present

## 2023-11-22 DIAGNOSIS — G309 Alzheimer's disease, unspecified: Secondary | ICD-10-CM | POA: Diagnosis not present

## 2023-12-05 DIAGNOSIS — M25512 Pain in left shoulder: Secondary | ICD-10-CM | POA: Diagnosis not present

## 2023-12-05 DIAGNOSIS — M19012 Primary osteoarthritis, left shoulder: Secondary | ICD-10-CM | POA: Diagnosis not present
